# Patient Record
Sex: Male | Born: 1990 | Race: White | Hispanic: No | Marital: Single | Smoking: Never smoker
Health system: Southern US, Community
[De-identification: ages and names within clinical notes are randomized; demographics above are authoritative.]

## PROBLEM LIST (undated history)

## (undated) DIAGNOSIS — E0590 Thyrotoxicosis, unspecified without thyrotoxic crisis or storm: Secondary | ICD-10-CM

## (undated) DIAGNOSIS — Z789 Other specified health status: Secondary | ICD-10-CM

## (undated) HISTORY — PX: TYMPANOSTOMY TUBE PLACEMENT: SHX32

## (undated) NOTE — ED Provider Notes (Signed)
 Formatting of this note is different from the original. EMERGENCY DEPARTMENT ENCOUNTER NOTE  Chief complaint:  Chief Complaint  Patient presents with  ? Psychiatric Evaluation    SI, NOT TAKING PRESCRIBED MEDS IN WEEKS, PT REFUSING TO ANSWER ANY QUESTIONS   History of Present Illness:  Roberto Watts presents to the Emergency Department brought by his parents concerned for his mental health. He has a history of depression and stopped taking his medications a few weeks ago while attending college here at [employer]. He stopped attending class and was drinking and smoking marijuana and his parents picked him up to take him out of that environment and back home.  He agrees to see his doctor and start back on his medications at home. His father has made an appointment for him to see his doctor Tuesday. His father feels she is safe to leave her as long as he is accompanied by his family which he will be. He has recently voiced some vague suicidal ideations but has no plan and is not actively suicidal.  Past Medical History  Diagnosis Date  ? Asthma   ? ADHD (attention deficit hyperactivity disorder)   ? Depression   ? Anxiety    Allergies  Allergen Reactions  ? Penicillins    No current facility-administered medications for this encounter.   Current Outpatient Prescriptions  Medication Sig Dispense Refill  ? albuterol  (VENTOLIN  HFA;PROAIR ) 90 mcg/actuation inhaler Inhale 2 puffs into the lungs every 4 (four) hours.      ? amphetamine-dextroamphetamine (ADDERALL) 10 mg per tablet Take 10 mg by mouth daily.      ? naproxen (NAPROSYN) 500 MG tablet Take 500 mg by mouth 2 (two) times daily with meals.      ? sertraline (ZOLOFT) 50 MG tablet Take 50 mg by mouth daily. MAY TAKE 1 TO 2 TABLETS DAILY      ? cyclobenzaprine (FLEXERIL) 10 MG tablet Take 10 mg by mouth 2 (two) times daily.      ? fluticasone -salmeterol (ADVAIR) 100-50 mcg/dose diskus inhaler Inhale 1 puff into the lungs 2 (two) times daily.       ? fluticasone -salmeterol (ADVAIR) 250-50 mcg/dose diskus inhaler Inhale 1 puff into the lungs daily.        No past surgical history on file.  History   Social History  ? Marital Status: N/A    Spouse Name: N/A    Number of Children: N/A  ? Years of Education: N/A   Occupational History  ? Not on file.   Social History Main Topics  ? Smoking status: Not on file  ? Smokeless tobacco: Not on file  ? Alcohol Use: Not on file  ? Drug Use: Not on file  ? Sexually Active: Not on file   Other Topics Concern  ? Not on file   Social History Narrative  ? No narrative on file   No family history on file.  Review Of Systems:  A complete 10 point review of systems is negative except as stated in HPI  Physical Examination: BP 138/78  Pulse 65  Temp(Src) 98 F (36.7 C) (Oral)  Resp 16  SpO2 100%  General:  Nontoxic appearing, depressed-appearing 65 year old otherwise in no acute distress  Head and Neck:  Atraumatic. Neck with full range of motion no JVD or meningismus.  Eyes: EOMI, sclera white conjunctiva clear  Ears, Nose and oropharynx: External ears WNL, nose WNL, mucous membranes moist Chest:  Regular rate and rhythm and no extrasystoles were  noted.  No obvious murmurs, rubs, clicks, or gallops. Respiratory:  No respiratory distress. Clear to auscultation bilaterally Back: Normal range of motion.  GI:  Non-distended. Soft nontender Musculoskeletal:  No clubbing or cyanosis, no edema. No deformities. No signs of acute trauma. Pulses equal bilaterally Integument: Intact, normal color, no rash. Lymphatic:  No lymphadenopathy noted  Neurologic:  Alert & oriented x 3, CN 2-12 normal as tested, nonfocal Psychiatric:  Depressed affect  ED Course: Considering good followup and patient who is cooperative with future treatment plan he appears stable to be discharged. Father also feels this is safe.  Clinical Impression/Assessment and Plan: Depression, suicidal  ideation  Prentice All, MD 11/30/11 1437 Electronically signed by Prentice All, MD at 11/30/2011  2:37 PM EDT

## (undated) NOTE — ED Notes (Signed)
 Formatting of this note might be different from the original. PT DISCHARGED WITH PARENTS TO HOME. NO COMPLAINTS OR COMPLICATIONS NOTED.   Curtistine JENEANE Fellers, RN 11/30/11 [phone] Electronically signed by Curtistine JENEANE Fellers, RN at 11/30/2011  3:16 PM EDT

## (undated) NOTE — ED Notes (Signed)
 Formatting of this note might be different from the original. 20y/o male seen in ED after being brought in by EMS. Per EMS they were called by patient's father who reported the patient was suicidal. On arrival patient refused to answer any questions, he was resistant at first to changing clothing but did comply. Medications with him were prescribed by a Dr. Glover. Meds included Zofoft, Adderal and asthma meds. He states he is not taking meds as prescribed. Father and mother presented and informed Dr.Terzian that the want to take patient home with them to see his doctor. Patient finally did cooperate with assessment. He states he is depressed and has been for years. He states he will not harm self and has no plan to do so...they put words in my mouth. Mother does have a note scribbled on a receipt that was rambling and said he could not find himself, was apologetic. 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 Parents state they have an appt with Dr. Glover on Monday and will get patient admitted to a hospital nearer home. They plan to remain with patient all weekend until he is seen by a physician. Patient has been seen by a psychologist in past and states he did not want to keep talking about my feelings.   Nancy Diane Daneau, RN 11/30/11 [phone] Electronically signed by Inocente Loa Catholic, RN at 11/30/2011  2:48 PM EDT

---

## 2011-11-16 ENCOUNTER — Ambulatory Visit: Payer: Self-pay | Admitting: Internal Medicine

## 2011-11-18 LAB — BETA STREP CULTURE(ARMC)

## 2012-01-19 ENCOUNTER — Inpatient Hospital Stay: Payer: Self-pay | Admitting: Psychiatry

## 2012-01-19 LAB — SALICYLATE LEVEL: Salicylates, Serum: <1.7 mg/dL

## 2012-01-19 LAB — ETHANOL
Ethanol %: <0.003 % (ref 0.000–0.080)
Ethanol: <3 mg/dL

## 2012-01-19 LAB — COMPREHENSIVE METABOLIC PANEL
Albumin: 4.4 g/dL (ref 3.4–5.0)
Anion Gap: 5 — ABNORMAL LOW (ref 7–16)
Bilirubin,Total: 1.9 mg/dL — ABNORMAL HIGH (ref 0.2–1.0)
Calcium, Total: 9.1 mg/dL (ref 8.5–10.1)
Co2: 32 mmol/L (ref 21–32)
Osmolality: 281 (ref 275–301)
Potassium: 4.5 mmol/L (ref 3.5–5.1)
SGOT(AST): 23 U/L (ref 15–37)

## 2012-01-19 LAB — CBC
HCT: 44.3 % (ref 40.0–52.0)
MCH: 34.1 pg — ABNORMAL HIGH (ref 26.0–34.0)
Platelet: 171 10*3/uL (ref 150–440)
RBC: 4.46 10*6/uL (ref 4.40–5.90)
WBC: 5.7 10*3/uL (ref 3.8–10.6)

## 2012-01-19 LAB — DRUG SCREEN, URINE
Barbiturates, Ur Screen: NEGATIVE (ref ?–200)
Cannabinoid 50 Ng, Ur ~~LOC~~: POSITIVE (ref ?–50)
Cocaine Metabolite,Ur ~~LOC~~: NEGATIVE (ref ?–300)
Opiate, Ur Screen: NEGATIVE (ref ?–300)
Tricyclic, Ur Screen: POSITIVE (ref ?–1000)

## 2012-01-19 LAB — ACETAMINOPHEN LEVEL: Acetaminophen: <2 ug/mL

## 2012-01-19 LAB — TSH: Thyroid Stimulating Horm: 1.2 u[IU]/mL

## 2012-04-23 ENCOUNTER — Emergency Department: Payer: Self-pay | Admitting: Emergency Medicine

## 2012-04-23 LAB — COMPREHENSIVE METABOLIC PANEL
Albumin: 5.1 g/dL — ABNORMAL HIGH (ref 3.4–5.0)
Anion Gap: 10 (ref 7–16)
BUN: 20 mg/dL — ABNORMAL HIGH (ref 7–18)
Bilirubin,Total: 2.3 mg/dL — ABNORMAL HIGH (ref 0.2–1.0)
Chloride: 102 mmol/L (ref 98–107)
Creatinine: 1.13 mg/dL (ref 0.60–1.30)
EGFR (African American): >60
Osmolality: 281 (ref 275–301)
Potassium: 3.8 mmol/L (ref 3.5–5.1)
SGPT (ALT): 29 U/L (ref 12–78)
Total Protein: 8.3 g/dL — ABNORMAL HIGH (ref 6.4–8.2)

## 2012-04-23 LAB — URINALYSIS, COMPLETE
Bacteria: NONE SEEN
Bilirubin,UR: NEGATIVE
Blood: NEGATIVE
Glucose,UR: NEGATIVE mg/dL (ref 0–75)
Ketone: NEGATIVE
Leukocyte Esterase: NEGATIVE
Ph: 5 (ref 4.5–8.0)
Protein: 30
Specific Gravity: 1.025 (ref 1.003–1.030)
Squamous Epithelial: NONE SEEN
WBC UR: <1 /HPF (ref 0–5)

## 2012-04-23 LAB — CBC
HCT: 46.4 % (ref 40.0–52.0)
Platelet: 270 10*3/uL (ref 150–440)
RDW: 12.1 % (ref 11.5–14.5)
WBC: 10.7 10*3/uL — ABNORMAL HIGH (ref 3.8–10.6)

## 2012-04-23 LAB — TSH: Thyroid Stimulating Horm: 2.04 u[IU]/mL

## 2012-04-23 LAB — ETHANOL
Ethanol %: <0.003 % (ref 0.000–0.080)
Ethanol: <3 mg/dL

## 2012-04-24 LAB — DRUG SCREEN, URINE
Amphetamines, Ur Screen: NEGATIVE (ref ?–1000)
Benzodiazepine, Ur Scrn: NEGATIVE (ref ?–200)
Methadone, Ur Screen: NEGATIVE (ref ?–300)
Phencyclidine (PCP) Ur S: NEGATIVE (ref ?–25)
Tricyclic, Ur Screen: NEGATIVE (ref ?–1000)

## 2015-01-03 NOTE — Consult Note (Signed)
Brief Consult Note: Diagnosis: major depression severe.   Patient was seen by consultant.   Consult note dictated.   Recommend further assessment or treatment.   Orders entered.   Comments: Psychiatry: Patient seen. Also known to me from prior treatment. Patient meets commitment criteria and needs hospitalization. Recent behavior makes him inappropriate at our facility. Has been referred to [facility] for transfer soon.  Electronic Signatures: Audery Amellapacs, John T (MD)  (Signed 09-Aug-13 11:13)  Authored: Brief Consult Note   Last Updated: 09-Aug-13 11:13 by Audery Amellapacs, John T (MD)
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
## 2015-01-08 NOTE — H&P (Signed)
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 The patient is single and never married and lives with his parents who are 5645 and 23 years old. The patient comes for his first inpatient hospitalization to psychiatry at [facility] with the chief complaint "my parents tricked me. They called the cops and they brought me here.  I was feeling depressed and quit going to my classes and they got worried and they brought me home from my college, in [place]."   HISTORY OF PRESENT ILLNESS: When the patient was asked when he last felt well he reported that he is in college in [place] and he snapped at his girlfriend of 9 months and did not mean it and he let her go and then he got more depressed about the same.  He quit going to classes. His parents got worried and brought him back home. He was seen by Dr. Mordecai RasmussenJohn Clapacs on 01/17/2012 and he has an appointment coming up on 01/22/2012. He is being followed for depression. His parents got worried and they brought him for admission here. According to information obtained from the chart and IVC taken out, the patient has been depressed and angry, and getting into arguments, and getting into fights and talked about it to the psychiatrist.  PAST PSYCHIATRIC HISTORY: No history of inpatient hospitalization to psychiatry. No history of suicide attempt. Being followed by Dr. Mordecai RasmussenJohn Clapacs as stated above. No history of suicide attempts.  FAMILY HISTORY OF MENTAL ILLNESS:  No history of suicide in the family.  FAMILY HISTORY: Raised by parents.  Father works as a Research scientist (medical)lab technician at [employer]. Mother works for  [employer]. He has one younger brother, close to family.  PERSONAL HISTORY: He was born  [place]. He graduated from high school. Currently he in at [place], second year of college.  WORK HISTORY: First job lasted three years.  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    MILITARY HISTORY: None.   MARRIAGES: Never married. Has had only one relationship so far and this lasted 9 months. He let her go because he was feeling depressed himself and after he let her go he got more depressed. No children.  He went to high school prom.    ALCOHOL AND DRUGS: First drink of alcohol was in eighth grade. No problems with alcohol drinking. He gets a drink occasionally with friends. He denies street or prescription drug abuse. He does admit smoking THC every other day for two years. He last smoked a few days ago. Denies smoking nicotine cigarettes.   MEDICAL HISTORY: No history of known high blood pressure, no known history of diabetes mellitus. No history of motor vehicle accident. Never been unconscious. Allergic to penicillin. He is being followed at [facility], by Dr. Terance HartBronstein. His last appointment was six weeks ago. His next appointment is to be made as needed.  PHYSICAL EXAMINATION:  VITALS: Temperature 97.7, pulse 83 per minute and regular.  HEENT: Head is normocephalic, atraumatic. Pupils are equally round and reactive to light and accommodation. Fundi bilaterally benign. Extraocular movements visualized. Tympanic membranes visualized.   NECK: Soft and supple without any organomegaly, lymphadenopathy or thyromegaly.   CHECK:  Normal expansion. Normal breath sounds heard.  HEART: Normal S1 and S2 without any murmurs or gallops.  ABDOMEN: Soft, no organomegaly. Bowel sounds heard.   NEURO: Gait is normal. Romberg is negative. Cranial nerves II through XII grossly intact. DTRs 2+ and normal. Plantars have normal response.   MENTAL STATUS EXAMINATION: The patient is dressed in hospital pajamas, alert and oriented to place, person, and time and is fully  aware of the situation that brought him for admission to [facility].  He is upset and irritable about his admission here because he feels that his parents tricked him to bring him here.  Affect is appropriate with his mood which is low, down, and depressed. He stated who does not feel depressed. He does admit feeling depressed. He admits to feeling hopeless and helpless about himself at this time. He does admit to feeling worthless and useless at times but not at this time.  Admits that he sleeps too much. Regarding appetite, he stated that he eats whenever he can. No evidence of psychosis. Denies auditory or visual hallucinations. Denies hearing voices or seeing things. Denies paranoid or suspicious ideas. General knowledge information is fair. He could spell the word world forward and backward without any problem. He could count money. Recall and memory are good. Insight and judgment are guarded.   IMPRESSION:  AXIS I:  1. Major depressive disorder, single episode.  2. THC abuse. 3. Adjustment disorder with depressed mood.  AXIS II: Deferred.  AXIS III: None major.  AXIS IV: Severe - the patient broke up with his girlfriend of 9 months and since the breakup his depression got worse and he quit going to classes which made it worse and had to get help for the same.   AXIS V: GAF 25.  PLAN: The patient is admitted to [facility] for closer observation, evaluation and help. He will be started back on all the medication that he has been getting from Dr. Mordecai Rasmussen. During his stay in the hospital, he will be given milieu therapy and supportive counseling. He will take part in individual and group therapy where relationship issues will be addressed. At the time of discharge, his depression will get better and he will have better insight into his problems so that he will be functional and appropriate followup appointments will be made.  ____________________________ Jannet Mantis. Guss Bunde,  MD skc:slb D: 01/19/2012 16:59:52 ET     T: 01/20/2012 07:52:38 ET        JOB#: [id number] cc: Monika Salk K. Guss Bunde, MD, <Dictator> Beau Fanny MD ELECTRONICALLY SIGNED 01/25/2012 17:31

## 2015-01-08 NOTE — Consult Note (Signed)
Psychological Assessment  Lucrezia StarchBrandyn Young20of Evaluation: 5-6-13Administered: [facility] Multiphasic Personality Inventory-2 (MMPI-2) for Referral: [name] was referred for a psychological assessment by his physician, Sherrian DiversJ. Terry Clapacs.  He was admitted to Behavioral Medicine for treatment of increasing depression. Please see the history and physical and psychosocial history for further background information. An assessment of personality structure was requested. Mr. Roxy CedarYoung?s MMPI-2 protocol is compared to that of other adult males he obtained the following profile: [phone]*78"02?4+-195/3:#. The MMPI-2 validity scales indicate that the clinical profile is probably valid. Validity scale elevations suggest he is a seriously disturbed individual in the context of no known reason for secondary gain. PresentationHe reports that he is experiencing severe emotional distress characterized by dysphoric mood, agitation, worrying, and anhedonia. His affect is likely to be blunted or inappropriate. He sees little opportunity of improving his circumstances, further dampening his mood, yet his daily life is full of things that keep him interested. He is very fearful, easily frightened, and generally apprehensive. He frequently worries about something or someone. He is more sensitive and feels more intensely than most people. His feelings are easily hurt and he is inclined to take things hard. He easily becomes impatient with people and often has serious disagreements with people who are close to him. He is often irritable and grouchy. It makes him angry when people give him advice or hurry him. He also gets angry with himself for giving in to others so much. He has become so angry that he does not know what comes over him and he feels as though he will explode. At times he feels like smashing things. He reports that he has problems with attention and concentration and memory. He lacks self-confidence, believes that he is not as  good as other people and feels inferior and insecure. He has a hard time making decisions and he feels helpless when he has to make some important decisions. has had very strange and peculiar thoughts. He thinks and dreams of things that are too bad to talk about. He thinks that there is something wrong with his mind. Everything is going on too fast around him. He has often thought that strangers were looking at him critically and he is bothered by people on the street or in stores watching him. He is sure that he is being talked about, he is being plotted against, people say vulgar and insulting things about him and someone has it in for him. He has enemies who wish to harm him. If people had not had it in for him, he would have been much more successful. He is sure he is often being punished without cause. In everything he does lately, he thinks that he is being tested. thinks that most people will use unfair means and stretch the truth to get ahead. He often wonders what hidden reason another person may have for doing something nice for him. He believes that it is safer to trust nobody. He has often been misunderstood when he was trying to be helpful. He knows he is a burden to others. Relations: He reports that he is extremely introverted. At parties he is more likely to sit by himself or with one other person than to join in with the crowd. His behavior is likely to be unpredictable and inappropriate, also making others uneasy around him. He is suspicious and distrustful of others, and he avoids serious emotional relationships. He generally feels apathetic, socially isolated, and withdrawn, and he believes that no one understands him.  He feels lonely most of the time even when he is with people. He is likely not to speak to people until they speak to him. He wishes he was not so shy. When in a group of people he has trouble thinking of the right things to talk about. He has poor relations with his family and  is alienated from himself and others. Once in a while he feels hate toward members of his family whom he usually loves. Problem Areas: He reports few symptoms. During the last few years he has been well most of the time and he is in just as good physical health as most of his friends. He seldom worries about his health. Most nights he does not go to sleep without thoughts or ideas bothering him and he does not wake up fresh and rested most mornings. He usually has enough energy to do his work and he is about as able to work as he ever was. He is very likely to abuse substances. He is worried about sex. He denies suicidal ideation but he feels hopeless which increases the risk of suicide.  His prognosis is generally very guarded because his problems are chronic and severe. His ability to work may not be severely impaired as long as the job does not involve any appreciable amount of contact with people. Some type of psychopharmacologic intervention may be necessary to stabilize his thought processes and mood and to help him sleep. Short-term, behavioral interventions are warranted rather than any form of insight-oriented psychotherapy.are a number of specific issues that must be kept in mind when establishing and maintaining the therapeutic alliance: no one seems to understand him, he has difficulty starting to do things, he believes it is safer to trust nobody, he is so touchy on some subjects that he cannot talk about them, he gives up quickly when things go wrong or get difficult or because he thinks too little of his ability, he shrinks from facing a crisis or difficulty, he has done some bad things in the past that he will never tell anyone about, it makes him nervous when people ask him personal questions, he feels unable to tell anyone all about himself, he is hard to get to know, he is very stubborn, he is bothered greatly by the thought of making changes in his life, he hates going to doctors even when he  is sick, he has had a tragic loss in his life that he will never get over and it is hard for him to accept compliments. 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

## 2015-05-06 ENCOUNTER — Encounter: Payer: Self-pay | Admitting: Gynecology

## 2015-05-06 ENCOUNTER — Ambulatory Visit
Admission: EM | Admit: 2015-05-06 | Discharge: 2015-05-06 | Disposition: A | Discharge status: Home / Self Care | Payer: Worker's Compensation | Attending: Family Medicine | Admitting: Family Medicine

## 2015-05-06 DIAGNOSIS — X58XXXS Exposure to other specified factors, sequela: Secondary | ICD-10-CM | POA: Insufficient documentation

## 2015-05-06 DIAGNOSIS — T675XXS Heat exhaustion, unspecified, sequela: Secondary | ICD-10-CM | POA: Diagnosis not present

## 2015-05-06 DIAGNOSIS — N179 Acute kidney failure, unspecified: Secondary | ICD-10-CM | POA: Diagnosis present

## 2015-05-06 LAB — BASIC METABOLIC PANEL
Anion gap: 13 (ref 5–15)
BUN: 18 mg/dL (ref 6–20)
CHLORIDE: 101 mmol/L (ref 101–111)
CO2: 24 mmol/L (ref 22–32)
CREATININE: 1.26 mg/dL — AB (ref 0.61–1.24)
Calcium: 10.2 mg/dL (ref 8.9–10.3)
GFR calc non Af Amer: >60 mL/min (ref >60)
Glucose, Bld: 90 mg/dL (ref 65–99)
Potassium: 3.8 mmol/L (ref 3.5–5.1)
Sodium: 138 mmol/L (ref 135–145)

## 2015-05-06 NOTE — ED Provider Notes (Signed)
CSN: [id number]     Arrival date & time 05/06/15  1328 History   None    Chief Complaint  Patient presents with  . Follow-up    Seen at [facility] on 05/03/2015 for acute kidney injury and dehydration   (Consider location/radiation/quality/duration/timing/severity/associated sxs/prior Treatment) HPI   24 year old male coming by his father is here for follow-up. He had heat exhaustion on 05/03/2015. 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 He is discharged all living day but his father brings him in today for a follow-up and a recheck of his kidney function. The patient is alert and oriented although he seems tired and weak. He is scheduled to return to work on Monday.  Past Medical History  Diagnosis Date  . Thyroid disease    Past Surgical History  Procedure Laterality Date  . Tympanostomy tube placement     No family history on file. Social History  Substance Use Topics  . Smoking status: Never Smoker   . Smokeless tobacco: None  . Alcohol Use: No    Review of Systems  Constitutional: Positive for fatigue.  All other systems reviewed and are negative.   Allergies  Advil and Penicillins  Home Medications   Prior to Admission medications   Medication Sig Start Date End Date Taking? Authorizing Provider  levothyroxine (SYNTHROID, LEVOTHROID) 25 MCG tablet Take 25 mcg by mouth daily before breakfast.   Yes Historical Provider, MD   BP 138/87 mmHg  Pulse 86  Temp(Src) 97.6 F (36.4 C) (Tympanic)  Ht 6' (1.829 m)  Wt 145 lb (65.772 kg)  BMI 19.66 kg/m2  SpO2 100% Physical Exam  Constitutional: He is oriented to person, place, and time. He appears well-developed and well-nourished.  HENT:  Head: Normocephalic and atraumatic.  Right Ear: External ear normal.  Left Ear: External ear normal.  Eyes: Pupils are equal, round, and  reactive to light.  Cardiovascular: Normal rate, regular rhythm and normal heart sounds.  Exam reveals no gallop and no friction rub.   No murmur heard. Pulmonary/Chest: Effort normal and breath sounds normal. No respiratory distress. He has no wheezes. He has no rales.  Abdominal: Soft. Bowel sounds are normal.  Musculoskeletal: Normal range of motion.  Neurological: He is alert and oriented to person, place, and time.  Skin: Skin is warm and dry.  Psychiatric: He has a normal mood and affect. His behavior is normal. Judgment normal.  Nursing note and vitals reviewed.   ED Course  Procedures (including critical care time) Labs Review Labs Reviewed  BASIC METABOLIC PANEL - Abnormal; Notable for the following:    Creatinine, Ser 1.26 (*)    All other components within normal limits    Imaging Review No results found.   MDM   1. Heat exhaustion, sequela    Plan: 1. Test/x-ray results and diagnosis reviewed with patient 2. rx as per orders; risks, benefits, potential side effects reviewed with patient 3. Recommend supportive treatment with fluids,rest protein diet  4. F/u prn if symptoms worsen or don't improve I've advised the patient to increase his fluid intake to keep his urine color the same as the water. He should rest a good quality food and should be return to work by Wednesday with no strenuous activity for 1 week. He should follow-up with his primary care physician as soon as possible. No  medication is necessary at this time. He may return here for follow-up as necessary    Lutricia Feil, PA-C 05/06/15 1632

## 2015-05-06 NOTE — ED Notes (Signed)
Patient c/o follow up from hospital visit. Per patient dad would like repeat blood work to check kidney function.

## 2016-05-15 ENCOUNTER — Emergency Department
Admission: EM | Admit: 2016-05-15 | Discharge: 2016-05-16 | Disposition: A | Discharge status: Other Acute Inpatient Hospital | Payer: BLUE CROSS/BLUE SHIELD | Attending: Emergency Medicine | Admitting: Emergency Medicine

## 2016-05-15 DIAGNOSIS — R45851 Suicidal ideations: Secondary | ICD-10-CM | POA: Diagnosis present

## 2016-05-15 DIAGNOSIS — F1911 Other psychoactive substance abuse, in remission: Secondary | ICD-10-CM | POA: Insufficient documentation

## 2016-05-15 DIAGNOSIS — F329 Major depressive disorder, single episode, unspecified: Secondary | ICD-10-CM | POA: Insufficient documentation

## 2016-05-15 DIAGNOSIS — F1490 Cocaine use, unspecified, uncomplicated: Secondary | ICD-10-CM | POA: Diagnosis not present

## 2016-05-15 DIAGNOSIS — F1290 Cannabis use, unspecified, uncomplicated: Secondary | ICD-10-CM | POA: Diagnosis not present

## 2016-05-15 DIAGNOSIS — Z79899 Other long term (current) drug therapy: Secondary | ICD-10-CM | POA: Diagnosis not present

## 2016-05-15 DIAGNOSIS — F333 Major depressive disorder, recurrent, severe with psychotic symptoms: Secondary | ICD-10-CM | POA: Diagnosis not present

## 2016-05-15 DIAGNOSIS — F32A Depression, unspecified: Secondary | ICD-10-CM

## 2016-05-15 LAB — COMPREHENSIVE METABOLIC PANEL
ALT: 40 U/L (ref 17–63)
AST: 32 U/L (ref 15–41)
Albumin: 4.8 g/dL (ref 3.5–5.0)
Alkaline Phosphatase: 93 U/L (ref 38–126)
Anion gap: 10 (ref 5–15)
BILIRUBIN TOTAL: 2.2 mg/dL — AB (ref 0.3–1.2)
BUN: 15 mg/dL (ref 6–20)
CO2: 26 mmol/L (ref 22–32)
CREATININE: 0.71 mg/dL (ref 0.61–1.24)
Calcium: 9.8 mg/dL (ref 8.9–10.3)
Chloride: 102 mmol/L (ref 101–111)
GFR calc Af Amer: >60 mL/min (ref >60)
Glucose, Bld: 99 mg/dL (ref 65–99)
POTASSIUM: 4.2 mmol/L (ref 3.5–5.1)
Sodium: 138 mmol/L (ref 135–145)
TOTAL PROTEIN: 7.6 g/dL (ref 6.5–8.1)

## 2016-05-15 LAB — CBC
HCT: 45.4 % (ref 40.0–52.0)
Hemoglobin: 16.1 g/dL (ref 13.0–18.0)
MCH: 33.5 pg (ref 26.0–34.0)
MCHC: 35.6 g/dL (ref 32.0–36.0)
MCV: 94 fL (ref 80.0–100.0)
PLATELETS: 163 10*3/uL (ref 150–440)
RBC: 4.82 MIL/uL (ref 4.40–5.90)
RDW: 11.9 % (ref 11.5–14.5)
WBC: 5 10*3/uL (ref 3.8–10.6)

## 2016-05-15 LAB — URINE DRUG SCREEN, QUALITATIVE (ARMC ONLY)
Amphetamines, Ur Screen: NOT DETECTED
BENZODIAZEPINE, UR SCRN: NOT DETECTED
Barbiturates, Ur Screen: NOT DETECTED
CANNABINOID 50 NG, UR ~~LOC~~: NOT DETECTED
Cocaine Metabolite,Ur ~~LOC~~: NOT DETECTED
MDMA (Ecstasy)Ur Screen: NOT DETECTED
Methadone Scn, Ur: NOT DETECTED
Opiate, Ur Screen: NOT DETECTED
PHENCYCLIDINE (PCP) UR S: NOT DETECTED
Tricyclic, Ur Screen: NOT DETECTED

## 2016-05-15 LAB — SALICYLATE LEVEL: Salicylate Lvl: <4 mg/dL (ref 2.8–30.0)

## 2016-05-15 LAB — ACETAMINOPHEN LEVEL: Acetaminophen (Tylenol), Serum: <10 ug/mL — ABNORMAL LOW (ref 10–30)

## 2016-05-15 LAB — ETHANOL

## 2016-05-15 NOTE — ED Notes (Signed)
Pts mother Ike BeneKelly Couts left phone number [phone]

## 2016-05-15 NOTE — ED Provider Notes (Signed)
[facility] Emergency Department Provider Note    ____________________________________________   I have reviewed the triage vital signs and the nursing notes.   HISTORY  Chief Complaint Suicidal   History limited by: Poor historian    HPI Roberto Watts is a 25 y.o. male  who presents to the emergency department today under IVC paperwork because of concerns for depression and suicidal ideation. The patient states that he has had depression however will not tell me how long it is been going on however when asked if it is been going on years he says perhaps. He denies ever being put on medication for his depression. He denies ever seeing anyone in the past for his depression however he is coming from away since. He was IVC there. He does state that he has been having thoughts of wanting to hurt himself. He denies any medical complaints. Denies any fevers, nausea vomiting or diarrhea. No chest pain or shortness breath.   Past Medical History:  Diagnosis Date  . Thyroid disease     There are no active problems to display for this patient.   Past Surgical History:  Procedure Laterality Date  . TYMPANOSTOMY TUBE PLACEMENT      Prior to Admission medications   Medication Sig Start Date End Date Taking? Authorizing Provider  levothyroxine (SYNTHROID, LEVOTHROID) 25 MCG tablet Take 25 mcg by mouth daily before breakfast.    Historical Provider, MD    Allergies Advil [ibuprofen] and Penicillins  No family history on file.  Social History Social History  Substance Use Topics  . Smoking status: Never Smoker  . Smokeless tobacco: Never Used  . Alcohol use No    Review of Systems  Constitutional: Negative for fever. Cardiovascular: Negative for chest pain. Respiratory: Negative for shortness of breath. Gastrointestinal: Negative for abdominal pain, vomiting and diarrhea. Neurological: Negative for headaches, focal weakness or numbness.  10-point  ROS otherwise negative.  ____________________________________________   PHYSICAL EXAM:  VITAL SIGNS: ED Triage Vitals [05/15/16 1520]  Enc Vitals Group     BP 129/90     Pulse Rate (!) 101     Resp 16     Temp 98.2 F (36.8 C)     Temp Source Oral     SpO2 98 %     Weight 160 lb (72.6 kg)     Height 5\' 11"  (1.803 m)   Constitutional: Alert and oriented. Slightly depressed. Eyes: Conjunctivae are normal. Normal extraocular movements. ENT   Head: Normocephalic and atraumatic.   Nose: No congestion/rhinnorhea.   Mouth/Throat: Mucous membranes are moist.   Neck: No stridor. Hematological/Lymphatic/Immunilogical: No cervical lymphadenopathy. Cardiovascular: Normal rate, regular rhythm.  No murmurs, rubs, or gallops. Respiratory: Normal respiratory effort without tachypnea nor retractions. Breath sounds are clear and equal bilaterally. No wheezes/rales/rhonchi. Gastrointestinal: Soft and nontender. No distention.  Genitourinary: Deferred Musculoskeletal: Normal range of motion in all extremities. No lower extremity edema. Neurologic:  Normal speech and language. No gross focal neurologic deficits are appreciated.  Skin:  Skin is warm, dry and intact. No rash noted. Psychiatric: Slightly depressed. Endorses suicidal ideation.  ____________________________________________    LABS (pertinent positives/negatives)  Labs Reviewed  COMPREHENSIVE METABOLIC PANEL - Abnormal; Notable for the following:       Result Value   Total Bilirubin 2.2 (*)    All other components within normal limits  ACETAMINOPHEN LEVEL - Abnormal; Notable for the following:    Acetaminophen (Tylenol), Serum <10 (*)    All other  components within normal limits  ETHANOL  SALICYLATE LEVEL  CBC  URINE DRUG SCREEN, QUALITATIVE ([facility] ONLY)    ____________________________________________   EKG  None  ____________________________________________     RADIOLOGY  None  ____________________________________________   PROCEDURES  Procedures  ____________________________________________   INITIAL IMPRESSION / ASSESSMENT AND PLAN / ED COURSE  Pertinent labs & imaging results that were available during my care of the patient were reviewed by me and considered in my medical decision making (see chart for details).  Patient presents to the emergency department today under IVC because of concerns for depression suicidal ideation. On exam patient does appear slightly depressed. He is a somewhat poor historian. He does however endorse depression, thoughts of hopelessness as well as suicidal ideation. Will have patient be seen by psychiatry. Will continue IVC. ____________________________________________   FINAL CLINICAL IMPRESSION(S) / ED DIAGNOSES  Final diagnoses:  Depression  Suicidal ideation     Note: This dictation was prepared with Dragon dictation. Any transcriptional errors that result from this process are unintentional    Phineas Semen, MD 05/15/16 [phone]

## 2016-05-15 NOTE — ED Notes (Signed)
IVC/ Consult pending 

## 2016-05-15 NOTE — ED Notes (Signed)
Pt given crackers and a drink 

## 2016-05-15 NOTE — ED Triage Notes (Signed)
Pt ivc sent from [facility] for suicidal thoughts and plan to shoot self

## 2016-05-16 ENCOUNTER — Inpatient Hospital Stay
Admit: 2016-05-16 | Discharge: 2016-05-19 | DRG: 885 | Disposition: A | Discharge status: Other Acute Inpatient Hospital | Payer: BLUE CROSS/BLUE SHIELD | Attending: Psychiatry | Admitting: Psychiatry

## 2016-05-16 DIAGNOSIS — F333 Major depressive disorder, recurrent, severe with psychotic symptoms: Principal | ICD-10-CM | POA: Diagnosis present

## 2016-05-16 DIAGNOSIS — Z880 Allergy status to penicillin: Secondary | ICD-10-CM | POA: Diagnosis not present

## 2016-05-16 DIAGNOSIS — S0990XA Unspecified injury of head, initial encounter: Secondary | ICD-10-CM | POA: Diagnosis present

## 2016-05-16 DIAGNOSIS — I959 Hypotension, unspecified: Secondary | ICD-10-CM | POA: Diagnosis present

## 2016-05-16 DIAGNOSIS — Z9181 History of falling: Secondary | ICD-10-CM

## 2016-05-16 DIAGNOSIS — Z886 Allergy status to analgesic agent status: Secondary | ICD-10-CM | POA: Diagnosis not present

## 2016-05-16 DIAGNOSIS — R000 Tachycardia, unspecified: Secondary | ICD-10-CM | POA: Diagnosis present

## 2016-05-16 DIAGNOSIS — F250 Schizoaffective disorder, bipolar type: Secondary | ICD-10-CM | POA: Diagnosis present

## 2016-05-16 DIAGNOSIS — W19XXXA Unspecified fall, initial encounter: Secondary | ICD-10-CM | POA: Diagnosis present

## 2016-05-16 DIAGNOSIS — Z818 Family history of other mental and behavioral disorders: Secondary | ICD-10-CM | POA: Diagnosis not present

## 2016-05-16 DIAGNOSIS — F29 Unspecified psychosis not due to a substance or known physiological condition: Secondary | ICD-10-CM

## 2016-05-16 DIAGNOSIS — Y92239 Unspecified place in hospital as the place of occurrence of the external cause: Secondary | ICD-10-CM | POA: Diagnosis present

## 2016-05-16 DIAGNOSIS — E860 Dehydration: Secondary | ICD-10-CM | POA: Diagnosis present

## 2016-05-16 DIAGNOSIS — R45851 Suicidal ideations: Secondary | ICD-10-CM | POA: Diagnosis present

## 2016-05-16 DIAGNOSIS — Z9119 Patient's noncompliance with other medical treatment and regimen: Secondary | ICD-10-CM | POA: Diagnosis not present

## 2016-05-16 DIAGNOSIS — F3160 Bipolar disorder, current episode mixed, unspecified: Secondary | ICD-10-CM

## 2016-05-16 DIAGNOSIS — Z9889 Other specified postprocedural states: Secondary | ICD-10-CM | POA: Diagnosis not present

## 2016-05-16 HISTORY — DX: Other specified health status: Z78.9

## 2016-05-16 MED ORDER — QUETIAPINE FUMARATE 25 MG PO TABS: 100.0000 mg | ORAL_TABLET | Freq: Every day | ORAL | Status: DC

## 2016-05-16 MED ORDER — ACETAMINOPHEN 325 MG PO TABS: 650.0000 mg | ORAL_TABLET | Freq: Four times a day (QID) | ORAL | Status: DC | PRN

## 2016-05-16 MED ORDER — MAGNESIUM HYDROXIDE 400 MG/5ML PO SUSP: 30.0000 mL | Freq: Every day | ORAL | Status: DC | PRN

## 2016-05-16 MED ORDER — ESCITALOPRAM OXALATE 10 MG PO TABS
10.0000 mg | ORAL_TABLET | Freq: Every day | ORAL | Status: DC
Start: 2016-05-16 — End: 2016-05-16
  Filled 2016-05-16: qty 1

## 2016-05-16 MED ORDER — QUETIAPINE FUMARATE 100 MG PO TABS
100.0000 mg | ORAL_TABLET | Freq: Every day | ORAL | Status: DC
  Administered 2016-05-17: 100 mg via ORAL
  Filled 2016-05-16: qty 1

## 2016-05-16 MED ORDER — ALUM & MAG HYDROXIDE-SIMETH 200-200-20 MG/5ML PO SUSP: 30.0000 mL | ORAL | Status: DC | PRN

## 2016-05-16 MED ORDER — ESCITALOPRAM OXALATE 10 MG PO TABS
10.0000 mg | ORAL_TABLET | Freq: Every day | ORAL | Status: DC
  Administered 2016-05-18: 10 mg via ORAL
  Filled 2016-05-16 (×2): qty 1

## 2016-05-16 NOTE — Progress Notes (Signed)
Patient ID: Roberto Watts, male   DOB: 1991/06/01, 25 y.o.   MRN: [id number]  [facility] Pt is alert and oriented x4. He reports his reason for admission is "just feeling down." Pt rates depression 2/10 and anxiety 5/10 at this time. Denies SI/HI/AVH. Pt reports that he has not been taking his medications because "I'm better without them." Pt laughs inappropriately at times during assessment and wrap up group. He appears to have no insight into his condition. He reports that his goal is "to get out." Pt refuses nighttime medications despite encouragement from Clinical research associatewriter. Treatment agreement, including group attendance and medication compliance, reviewed with pt and IVC status explained. Skin assessment performed and no contraband found. Pt has tattoos over his left chest, back, and left lower leg. Pt oriented to unit. q15 minute safety checks maintained. Pt remains free from harm. Will continue to monitor.

## 2016-05-16 NOTE — ED Provider Notes (Signed)
-----------------------------------------   7:42 AM on 05/16/2016 -----------------------------------------   Blood pressure 111/75, pulse 78, temperature 97.9 F (36.6 C), temperature source Oral, resp. rate 18, height 5\' 11"  (1.803 m), weight 160 lb (72.6 kg), SpO2 100 %.  The patient had no acute events since last update.  Calm and cooperative at this time.  Disposition is pending Psychiatry/Behavioral Medicine team recommendations.     Jeanmarie PlantJames A McShane, MD 05/16/16 [phone]

## 2016-05-16 NOTE — ED Notes (Signed)
Lunch served

## 2016-05-16 NOTE — ED Notes (Signed)
Pt awaiting telepysch.

## 2016-05-16 NOTE — ED Notes (Signed)
[facility] Pt going to room 4. Pt denies pain. Pt is Alert and Orient x 3. Pt denies SI and HI.

## 2016-05-16 NOTE — ED Notes (Signed)
Patient is sleeping, no signs of distress, q 15 min. Checks, and camera monitoring in progress.  

## 2016-05-16 NOTE — ED Notes (Signed)
Patient is alert and oriented, Patient states that He does hear voices at times, but not often, He denies Si/hi or avh at this time. q 15 min. Checks and camera monitoring in progress.

## 2016-05-16 NOTE — ED Notes (Signed)
[facility]

## 2016-05-16 NOTE — BH Assessment (Signed)
Assessment Note  Roberto Watts is an 25 y.o. male presenting to the ED under IVC, initiated by his therapist at [facility], due to concerns for depression and suicidal ideations without intent or plan.  Patient reports ongoing symptoms of depression.  He is currently attending counseling sessions but states he is not on any antidepressants.  He states he has been having thoughts of hurting but did not have any plans or intent.  Pt denies HI and any auditory/visual hallucinations.  He denies any drug/alcohol use.  Diagnosis: Depression  Past Medical History:  Past Medical History:  Diagnosis Date  . Thyroid disease     Past Surgical History:  Procedure Laterality Date  . TYMPANOSTOMY TUBE PLACEMENT      Family History: No family history on file.  Social History:  reports that he has never smoked. He has never used smokeless tobacco. He reports that he uses drugs, including Cocaine and Marijuana. He reports that he does not drink alcohol.  Additional Social History:     CIWA: CIWA-Ar BP: 122/69 Pulse Rate: 76 COWS:    Allergies:  Allergies  Allergen Reactions  . Advil [Ibuprofen] Swelling  . Penicillins Swelling    Home Medications:  (Not in a hospital admission)  OB/GYN Status:  No LMP for male patient.  General Assessment Data Location of Assessment: [facility] ED TTS Assessment: In system Is this a Tele or Face-to-Face Assessment?: Face-to-Face Is this an Initial Assessment or a Re-assessment for this encounter?: Initial Assessment Marital status: Single Maiden name: n/a Is patient pregnant?: No Pregnancy Status: No Living Arrangements: Alone Can pt return to current living arrangement?: Yes Admission Status: Involuntary Is patient capable of signing voluntary admission?: Yes Referral Source: Other Insurance type: Scientist, research (physical sciences) Exam [facility] Walk-in ONLY) Medical Exam completed: Yes  Crisis Care Plan Living Arrangements: Alone Legal Guardian:  Other: (self) Name of Psychiatrist: [facility] Name of Therapist: [facility]  Education Status Is patient currently in school?: No Current Grade: n/a Highest grade of school patient has completed: n/a Name of school: n/a Contact person: n/a  Risk to self with the past 6 months Suicidal Ideation: Yes-Currently Present Has patient been a risk to self within the past 6 months prior to admission? : No Suicidal Intent: No Has patient had any suicidal intent within the past 6 months prior to admission? : No Is patient at risk for suicide?: No Suicidal Plan?: No Has patient had any suicidal plan within the past 6 months prior to admission? : No Access to Means: No What has been your use of drugs/alcohol within the last 12 months?: None reported Previous Attempts/Gestures: No How many times?: 0 Other Self Harm Risks: None identified Triggers for Past Attempts: None known Intentional Self Injurious Behavior: None Family Suicide History: No Recent stressful life event(s): Other (Comment) Persecutory voices/beliefs?: No Depression: Yes Depression Symptoms: Loss of interest in usual pleasures, Feeling worthless/self pity Substance abuse history and/or treatment for substance abuse?: No Suicide prevention information given to non-admitted patients: Not applicable  Risk to Others within the past 6 months Homicidal Ideation: No Does patient have any lifetime risk of violence toward others beyond the six months prior to admission? : No Thoughts of Harm to Others: No Current Homicidal Intent: No Current Homicidal Plan: No Access to Homicidal Means: No Identified Victim: none identified History of harm to others?: No Assessment of Violence: None Noted Violent Behavior Description: none identified Does patient have access to weapons?: No Criminal Charges Pending?:  No Does patient have a court date: No Is patient on probation?: No  Psychosis Hallucinations: None  noted Delusions: None noted  Mental Status Report Appearance/Hygiene: In scrubs Eye Contact: Fair Motor Activity: Unremarkable, Freedom of movement Speech: Logical/coherent Level of Consciousness: Alert Mood: Depressed Affect: Appropriate to circumstance, Depressed Anxiety Level: Minimal Thought Processes: Relevant Judgement: Unimpaired Orientation: Person, Place, Time, Situation Obsessive Compulsive Thoughts/Behaviors: None  Cognitive Functioning Concentration: Normal Memory: Recent Intact, Remote Intact IQ: Average Insight: Good Impulse Control: Good Appetite: Fair Sleep: No Change Vegetative Symptoms: None  ADLScreening [place]([facility] Assessment Services) Patient's cognitive ability adequate to safely complete daily activities?: Yes Patient able to express need for assistance with ADLs?: Yes Independently performs ADLs?: Yes (appropriate for developmental age)  Prior Inpatient Therapy Prior Inpatient Therapy: No Prior Therapy Dates: n/a Prior Therapy Facilty/Provider(s): n/a Reason for Treatment: n/a  Prior Outpatient Therapy Prior Outpatient Therapy: Yes Prior Therapy Dates: current Prior Therapy Facilty/Provider(s): [facility] Reason for Treatment: depression Does patient have an ACCT team?: No Does patient have Intensive In-House Services?  : No Does patient have [facility] services? : No Does patient have P4CC services?: No  ADL Screening (condition at time of admission) Patient's cognitive ability adequate to safely complete daily activities?: Yes Patient able to express need for assistance with ADLs?: Yes Independently performs ADLs?: Yes (appropriate for developmental age)             Merchant navy officerAdvance Directives (For Healthcare) Does patient have an advance directive?: No Would patient like information on creating an advanced directive?: Yes English as a second language teacher- Educational materials given    Additional Information 1:1 In Past 12 Months?: No CIRT Risk: No Elopement Risk: No Does  patient have medical clearance?: Yes     Disposition:  Disposition Initial Assessment Completed for this Encounter: Yes Disposition of Patient: Inpatient treatment program, Other dispositions Type of inpatient treatment program: Adult Other disposition(s): Other (Comment) (Pending Psych MD review)  On Site Evaluation by:   Reviewed with Physician:    Artist Beachoxana C Xylah Early 05/16/2016 6:33 AM

## 2016-05-16 NOTE — Progress Notes (Addendum)
Patient [facility] to be [facility] Toni Amendlapacs.  Attending Physician will be Dr. Jennet MaduroPucilowska.   Patient [facility] room [facility] Keomah VillagePhyllis.   Intake Paper Work has been signed and placed on patient chart.  ER staff is aware of the admission Irving Burton( [facility]  ER MD; Toniann FailWendy Patient's Nurse & Lowanda FosterBrittany Patient Access).  Cheryl FlashNicole Bryker Fletchall, MS, NCC, LPCA Therapeutic Triage Specialist

## 2016-05-16 NOTE — ED Notes (Signed)
Patient alert and oriented, sitting in room, no signs of distress, denies Si/Hi at this times, denies Si/hi or avh. Patient with q 15 min. Checks and camera monitoring in progress at all times.

## 2016-05-16 NOTE — ED Notes (Signed)
Report given to Psych from telepsych.

## 2016-05-16 NOTE — ED Notes (Signed)
Patient is alert and oriented, He wanted to watch tv, states that He is bored, patient talked to nurse about how he went for 3 years without any problems and now He wants to get back on track. Patient is pleasant and cooperative. No signs of distress. q 15 min. Checks and camera monitoring.

## 2016-05-16 NOTE — ED Notes (Signed)
Patient noted in room. No complaints, stable, in no acute distress. Q15 minute rounds and monitoring via Security Cameras to continue.  

## 2016-05-16 NOTE — ED Notes (Signed)
Supper tray given.

## 2016-05-16 NOTE — ED Notes (Signed)
Patient's breakfast served, no signs of distress at this time.

## 2016-05-16 NOTE — ED Notes (Addendum)
Per Dr. Larae GroomsJacob's recommendation to Inpatient Psych, Continue IVC, and no medications at this time. Dr. Larae GroomsJacob's will fax over report.

## 2016-05-16 NOTE — ED Notes (Signed)
Patient is sleeping, nurse checked on him and he states that He is ok, but sleepy, states He does not sleep well at home, patient is safe, q 15 min. Checks and camera monitoring in progress.

## 2016-05-16 NOTE — Consult Note (Signed)
[facility] Face-to-Face Psychiatry Consult   Reason for Consult:  Consult for this 25-year-old man with a history of recurrent depression sent here from [facility] because of suicidal statements. Referring Physician:  [place]ane Patient Identification: Roberto Watts MRN:  [id number] Principal Diagnosis: Severe recurrent major depression with psychotic features (HCC) Diagnosis:   Patient Active Problem List   Diagnosis Date Noted  . Severe recurrent major depression with psychotic features (HCC) [F33.3] 05/16/2016    Total Time spent with patient: 1 hour  Subjective:   Roberto Watts is a 25 y.o. male patient admitted with "they must think I'm depressed".  HPI:  Patient interviewed. Chart reviewed. Patient known to me from previous encounters as well. This is a 25-year-old man with a history of mental health problems who was sent here on IVC from [facility]. He went there for evaluation and told them that he was having active suicidal thoughts. Apparently he then left there and went back home. They had become alarmed by his statements and filed commitment papers. Police picked him up from home and brought him in. Patient tells me that he feels like he wishes he were dead all of the time. Despite saying this he says that he doesn't think he is depressed. He talks about how he's upset because he doesn't have a job. Evidently he quit his job a few months ago because he felt like they were treating him badly. His description of it sounds rather paranoid. He says since then he just been staying with his parents not doing much although he claims to be looking for work. A lot. Eats a lot. Not taking any current psychiatric medicine. Had been taking Lexapro Trileptal and and in [place] injection at some point up to some months ago but then discontinued them. Patient repeats to me that he wishes he were dead all the time but denies any plan to act on it. Denies any hallucinations or psychotic symptoms. He is not a  very effective historian rather vague in his descriptions.  Social history: Patient lives with his biological parents. He is not currently working. Not going for any outpatient treatment. Says he spends most of his day doing not much of anything.  Medical history: Denies any significant known ongoing medical problems.  Substance abuse history: Patient has a past history of abuse of drugs but claims that he has not been using any alcohol or drugs anytime recently.  Past Psychiatric History: Patient has a past history of psychiatric admission and odd symptoms including symptoms of depression with paranoia and psychotic-like behavior. His insight has been poor about it. He had been treated in the past with a combination of antidepressants and antipsychotics however with pretty good response. Not clear that he's ever had a true bipolar episode. Doesn't have much insight into his paranoid and psychotic thinking.  Risk to Self: Suicidal Ideation: Yes-Currently Present Suicidal Intent: No Is patient at risk for suicide?: No Suicidal Plan?: No Access to Means: No What has been your use of drugs/alcohol within the last 12 months?: None reported How many times?: 0 Other Self Harm Risks: None identified Triggers for Past Attempts: None known Intentional Self Injurious Behavior: None Risk to Others: Homicidal Ideation: No Thoughts of Harm to Others: No Current Homicidal Intent: No Current Homicidal Plan: No Access to Homicidal Means: No Identified Victim: none identified History of harm to others?: No Assessment of Violence: None Noted Violent Behavior Description: none identified Does patient have access to weapons?: No Criminal Charges   Pending?: No Does patient have a court date: No Prior Inpatient Therapy: Prior Inpatient Therapy: No Prior Therapy Dates: n/a Prior Therapy Facilty/Provider(s): n/a Reason for Treatment: n/a Prior Outpatient Therapy: Prior Outpatient Therapy: Yes Prior  Therapy Dates: current Prior Therapy Facilty/Provider(s): [facility] Reason for Treatment: depression Does patient have an ACCT team?: No Does patient have Intensive In-House Services?  : No Does patient have [facility] services? : No Does patient have P4CC services?: No  Past Medical History:  Past Medical History:  Diagnosis Date  . Thyroid disease     Past Surgical History:  Procedure Laterality Date  . TYMPANOSTOMY TUBE PLACEMENT     Family History: No family history on file. Family Psychiatric  History: Denies knowing of any family history of any mental health problems Social History:  History  Alcohol Use No     History  Drug Use  . Types: Cocaine, Marijuana    Social History   Social History  . Marital status: Single    Spouse name: N/A  . Number of children: N/A  . Years of education: N/A   Social History Main Topics  . Smoking status: Never Smoker  . Smokeless tobacco: Never Used  . Alcohol use No  . Drug use:     Types: Cocaine, Marijuana  . Sexual activity: Not Asked   Other Topics Concern  . None   Social History Narrative  . None   Additional Social History:    Allergies:   Allergies  Allergen Reactions  . Advil [Ibuprofen] Swelling  . Penicillins Swelling    Labs:  Results for orders placed or performed during the hospital encounter of 05/15/16 (from the past 48 hour(s))  Comprehensive metabolic panel     Status: Abnormal   Collection Time: 05/15/16  3:29 PM  Result Value Ref Range   Sodium 138 135 - 145 mmol/L   Potassium 4.2 3.5 - 5.1 mmol/L   Chloride 102 101 - 111 mmol/L   CO2 26 22 - 32 mmol/L   Glucose, Bld 99 65 - 99 mg/dL   BUN 15 6 - 20 mg/dL   Creatinine, Ser 0.71 0.61 - 1.24 mg/dL   Calcium 9.8 8.9 - 10.3 mg/dL   Total Protein 7.6 6.5 - 8.1 g/dL   Albumin 4.8 3.5 - 5.0 g/dL   AST 32 15 - 41 U/L   ALT 40 17 - 63 U/L   Alkaline Phosphatase 93 38 - 126 U/L   Total Bilirubin 2.2 (H) 0.3 - 1.2 mg/dL   GFR calc non Af Amer >60  >60 mL/min   GFR calc Af Amer >60 >60 mL/min    Comment: (NOTE) The eGFR has been calculated using the CKD EPI equation. This calculation has not been validated in all clinical situations. eGFR's persistently <60 mL/min signify possible Chronic Kidney Disease.    Anion gap 10 5 - 15  Ethanol     Status: None   Collection Time: 05/15/16  3:29 PM  Result Value Ref Range   Alcohol, Ethyl (B) <5 <5 mg/dL    Comment:        LOWEST DETECTABLE LIMIT FOR SERUM ALCOHOL IS 5 mg/dL FOR MEDICAL PURPOSES ONLY   Salicylate level     Status: None   Collection Time: 05/15/16  3:29 PM  Result Value Ref Range   Salicylate Lvl <4.0 2.8 - 30.0 mg/dL  Acetaminophen level     Status: Abnormal   Collection Time: 05/15/16  3:29 PM  Result Value Ref Range     Acetaminophen (Tylenol), Serum <10 (L) 10 - 30 ug/mL    Comment:        THERAPEUTIC CONCENTRATIONS VARY SIGNIFICANTLY. A RANGE OF 10-30 ug/mL MAY BE AN EFFECTIVE CONCENTRATION FOR MANY PATIENTS. HOWEVER, SOME ARE BEST TREATED AT CONCENTRATIONS OUTSIDE THIS RANGE. ACETAMINOPHEN CONCENTRATIONS >150 ug/mL AT 4 HOURS AFTER INGESTION AND >50 ug/mL AT 12 HOURS AFTER INGESTION ARE OFTEN ASSOCIATED WITH TOXIC REACTIONS.   cbc     Status: None   Collection Time: 05/15/16  3:29 PM  Result Value Ref Range   WBC 5.0 3.8 - 10.6 K/uL   RBC 4.82 4.40 - 5.90 MIL/uL   Hemoglobin 16.1 13.0 - 18.0 g/dL    Comment: RESULT REPEATED AND VERIFIED   HCT 45.4 40.0 - 52.0 %   MCV 94.0 80.0 - 100.0 fL   MCH 33.5 26.0 - 34.0 pg   MCHC 35.6 32.0 - 36.0 g/dL   RDW 11.9 11.5 - 14.5 %   Platelets 163 150 - 440 K/uL  Urine Drug Screen, Qualitative     Status: None   Collection Time: 05/15/16  3:29 PM  Result Value Ref Range   Tricyclic, Ur Screen NONE DETECTED NONE DETECTED   Amphetamines, Ur Screen NONE DETECTED NONE DETECTED   MDMA (Ecstasy)Ur Screen NONE DETECTED NONE DETECTED   Cocaine Metabolite,Ur [place] NONE DETECTED NONE DETECTED   Opiate, Ur Screen NONE  DETECTED NONE DETECTED   Phencyclidine (PCP) Ur S NONE DETECTED NONE DETECTED   Cannabinoid 50 Ng, Ur [place] NONE DETECTED NONE DETECTED   Barbiturates, Ur Screen NONE DETECTED NONE DETECTED   Benzodiazepine, Ur Scrn NONE DETECTED NONE DETECTED   Methadone Scn, Ur NONE DETECTED NONE DETECTED    Comment: (NOTE) 676  Tricyclics, urine               Cutoff 1000 ng/mL 200  Amphetamines, urine             Cutoff 1000 ng/mL 300  MDMA (Ecstasy), urine           Cutoff 500 ng/mL 400  Cocaine Metabolite, urine       Cutoff 300 ng/mL 500  Opiate, urine                   Cutoff 300 ng/mL 600  Phencyclidine (PCP), urine      Cutoff 25 ng/mL 700  Cannabinoid, urine              Cutoff 50 ng/mL 800  Barbiturates, urine             Cutoff 200 ng/mL 900  Benzodiazepine, urine           Cutoff 200 ng/mL 1000 Methadone, urine                Cutoff 300 ng/mL 1100 1200 The urine drug screen provides only a preliminary, unconfirmed 1300 analytical test result and should not be used for non-medical 1400 purposes. Clinical consideration and professional judgment should 1500 be applied to any positive drug screen result due to possible 1600 interfering substances. A more specific alternate chemical method 1700 must be used in order to obtain a confirmed analytical result.  1800 Gas chromato graphy / mass spectrometry (GC/MS) is the preferred 1900 confirmatory method.     Current Facility-Administered Medications  Medication Dose Route Frequency Provider Last Rate Last Dose  . escitalopram (LEXAPRO) tablet 10 mg  10 mg Oral Daily Gonzella Lex, MD      . QUEtiapine (  SEROQUEL) tablet 100 mg  100 mg Oral QHS  T , MD       Current Outpatient Prescriptions  Medication Sig Dispense Refill  . benztropine (COGENTIN) 0.5 MG tablet Take 0.5 mg by mouth 2 (two) times daily.    . escitalopram (LEXAPRO) 10 MG tablet Take 10 mg by mouth daily.    . Oxcarbazepine (TRILEPTAL) 300 MG tablet Take 300 mg by  mouth 2 (two) times daily.      Musculoskeletal: Strength & Muscle Tone: within normal limits Gait & Station: normal Patient leans: N/A  Psychiatric Specialty Exam: Physical Exam  Nursing note and vitals reviewed. Constitutional: He appears well-developed and well-nourished.  HENT:  Head: Normocephalic and atraumatic.  Eyes: Conjunctivae are normal. Pupils are equal, round, and reactive to light.  Neck: Normal range of motion.  Cardiovascular: Regular rhythm and normal heart sounds.   Respiratory: Effort normal. No respiratory distress.  GI: Soft.  Musculoskeletal: Normal range of motion.  Neurological: He is alert.  Skin: Skin is warm and dry.  Psychiatric: His mood appears anxious. His affect is blunt and inappropriate. His speech is delayed. He is slowed. Thought content is paranoid. Cognition and memory are normal. He expresses impulsivity. He exhibits a depressed mood. He expresses suicidal ideation.    Review of Systems  Constitutional: Negative.   HENT: Negative.   Eyes: Negative.   Respiratory: Negative.   Cardiovascular: Negative.   Gastrointestinal: Negative.   Musculoskeletal: Negative.   Skin: Negative.   Neurological: Negative.   Psychiatric/Behavioral: Positive for depression and suicidal ideas.    Blood pressure 111/75, pulse 78, temperature 97.9 F (36.6 C), temperature source Oral, resp. rate 18, height 5' 11" (1.803 m), weight 72.6 kg (160 lb), SpO2 100 %.Body mass index is 22.32 kg/m.  General Appearance: Casual  Eye Contact:  Good  Speech:  Slow  Volume:  Decreased  Mood:  Dysphoric  Affect:  Congruent  Thought Process:  Goal Directed  Orientation:  Full (Time, Place, and Person)  Thought Content:  Paranoid Ideation  Suicidal Thoughts:  Yes.  without intent/plan  Homicidal Thoughts:  No  Memory:  Immediate;   Good Recent;   Fair Remote;   Fair  Judgement:  Fair  Insight:  Fair  Psychomotor Activity:  Decreased  Concentration:  Concentration:  Poor  Recall:  Fair  Fund of Knowledge:  Fair  Language:  Fair  Akathisia:  No  Handed:  Right  AIMS (if indicated):     Assets:  Housing Resilience Social Support  ADL's:  Intact  Cognition:  WNL  Sleep:        Treatment Plan Summary: Daily contact with patient to assess and evaluate symptoms and progress in treatment, Medication management and Plan This is a 25-year-old man with history of depression with psychotic features in the past. Currently appears to be paranoid and endorses suicidal ideation. Poor insight. Not very cooperative. His drug screen is negative. Patient is at risk to himself because of suicidality and I will pull the IVC and admit him to the psychiatric ward. Restart any antipsychotics and antidepressants. Full labs to be evaluated.  Disposition: Recommend psychiatric Inpatient admission when medically cleared. Supportive therapy provided about ongoing stressors.   , MD 05/16/2016 1:09 PM 

## 2016-05-16 NOTE — Tx Team (Signed)
Initial Treatment Plan 05/16/2016 10:59 PM Cindee LameBrandyn M Guse [id number]    PATIENT STRESSORS: Medication change or noncompliance Occupational concerns   PATIENT STRENGTHS: Average or above average intelligence Physical Health Supportive family/friends   PATIENT IDENTIFIED PROBLEMS: Depression "just feeling down"  Medication noncompliance  Suicidal ideation                 DISCHARGE CRITERIA:  Improved stabilization in mood, thinking, and/or behavior Motivation to continue treatment in a less acute level of care Need for constant or close observation no longer present Reduction of life-threatening or endangering symptoms to within safe limits Verbal commitment to aftercare and medication compliance  PRELIMINARY DISCHARGE PLAN: Attend aftercare/continuing care group Outpatient therapy Return to previous living arrangement  PATIENT/FAMILY INVOLVEMENT: This treatment plan has been presented to and reviewed with the patient, Cindee LameBrandyn M Edge, and/or family member.  The patient and family have been given the opportunity to ask questions and make suggestions.  Beckie BusingMichelle L Parsells, RN 05/16/2016, 10:59 PM

## 2016-05-16 NOTE — ED Notes (Signed)
[place] [employer] officer taking over for [employer] 

## 2016-05-16 NOTE — ED Notes (Signed)
Patient is sleeping, no signs of distress, nurse did wake him and ask if He would like to talk and He states that He had rather sleep longer. No signs of distress.

## 2016-05-17 ENCOUNTER — Encounter: Payer: Self-pay | Admitting: Psychiatry

## 2016-05-17 DIAGNOSIS — F333 Major depressive disorder, recurrent, severe with psychotic symptoms: Principal | ICD-10-CM

## 2016-05-17 DIAGNOSIS — F29 Unspecified psychosis not due to a substance or known physiological condition: Secondary | ICD-10-CM

## 2016-05-17 DIAGNOSIS — F3160 Bipolar disorder, current episode mixed, unspecified: Secondary | ICD-10-CM

## 2016-05-17 DIAGNOSIS — R45851 Suicidal ideations: Secondary | ICD-10-CM

## 2016-05-17 MED ORDER — OXCARBAZEPINE 300 MG PO TABS
300.0000 mg | ORAL_TABLET | Freq: Two times a day (BID) | ORAL | Status: DC
  Administered 2016-05-17 – 2016-05-19 (×4): 300 mg via ORAL
  Filled 2016-05-17 (×4): qty 1

## 2016-05-17 MED ORDER — OLANZAPINE 10 MG IM SOLR: 10.0000 mg | Freq: Every day | INTRAMUSCULAR | Status: DC

## 2016-05-17 MED ORDER — OLANZAPINE 5 MG PO TBDP
15.0000 mg | ORAL_TABLET | Freq: Every day | ORAL | Status: DC
  Administered 2016-05-17 – 2016-05-18 (×2): 15 mg via ORAL
  Filled 2016-05-17 (×2): qty 1

## 2016-05-17 NOTE — [facility] Group Notes (Signed)
[facility] Group Notes:  (Nursing/MHT/Case Management/Adjunct)  Date:  05/17/2016  Time:  6:11 PM  Type of Therapy:  Psychoeducational Skills  Participation Level:  Did Not Attend  Lynelle SmokeCara Travis [facility] 05/17/2016, 6:11 PM

## 2016-05-17 NOTE — Progress Notes (Signed)
[facility] Second Physician Opinion Progress Note for Medication Administration to Non-consenting Patients (For Involuntarily Committed Patients)  Patient: Roberto Watts Date of Birth: [id number]/04/1991 MRN: [id number]  Reason for the Medication: The patient, without the benefit of the specific treatment measure, is incapable of participating in any available treatment plan that will give the patient a realistic opportunity of improving the patient's condition. There is, without the benefit of the specific treatment measure, a significant possibility that the patient will harm self or others before improvement of the patient's condition is realized.  Consideration of Side Effects: Consideration of the side effects related to the medication plan has been given.  Rationale for Medication Administration: psychosis severe enough to impair functioning. Pt does not have medical capacity in order to understand need for treatment with antipsychotics    Roberto Watts,  Roberto Stender, MD 05/17/16  1:47 PM   This documentation is good for (7) seven days from the date of the MD signature. New documentation must be completed every seven (7) days with detailed justification in the medical record if the patient requires continued non-emergent administration of psychotropic medications.

## 2016-05-17 NOTE — [facility] Group Notes (Signed)
[facility] Group Notes:  (Nursing/MHT/Case Management/Adjunct)  Date:  05/17/2016  Time:  2:27 AM  Type of Therapy:  Group Therapy  Participation Level:  Active  Participation Quality:  Attentive  Affect:  Excited  Cognitive:  Oriented  Insight:  Lacking  Engagement in Group:  Lacking  Modes of Intervention:  Discussion  Summary of Progress/Problems: Pt did not have a goal because he had just gotten on the unit shortly before group. Staff observed pt laughing inappropriately. When staff asked pt what he was laughing at. Pt shrugged shoulders and continue to laugh to himself.   Fanny Skatesshley Imani Broedy Osbourne 05/17/2016, 2:27 AM

## 2016-05-17 NOTE — Progress Notes (Signed)
Patient was irritable and pacing in the hallway to get immediate discharge.Patient refused to take medicines.States"you are punishing me and I don't belong here."Denies suicidal or homicidal ideations and AV hallucinations.When patient was told about the force med order patient took the Zyprexa P.O.Patient was sleeping in the afternoon.After dinner patient was about to fall,stated that he was dizzy.Stated that medicine made him sick.Assisted patient back to bed.Fluids offered.Patient verbalized that he is not dizzy but he feels "druged".Resting in bed.

## 2016-05-17 NOTE — [facility] Group Notes (Addendum)
[facility] LCSW Group Therapy  05/17/2016 2:11 PM  Type of Therapy:  Group Therapy  Participation Level:  Minimal  Participation Quality:  Attentive  Affect:  Appropriate  Cognitive:  Alert  Insight:  Poor  Engagement in Therapy:  Limited  Modes of Intervention:  Activity, Discussion, Education and Support  Summary of Progress/Problems:Feelings around Relapse. Group members discussed the meaning of relapse and shared personal stories of relapse, how it affected them and others, and how they perceived themselves during this time. Group members were encouraged to identify triggers, warning signs and coping skills used when facing the possibility of relapse. Social supports were discussed and explored in detail. Patients also discussed facing disappointment and how that can trigger someone to relapse. Patient participated in the group discussion when prompted by the CSW. Patient stated he tends to isolate when feeling angry or upset with himself or others. CSW discussed healthy coping skills with the group.    Elnita Surprenant G. Garnette CzechSampson MSW, LCSWA 05/17/2016, 2:13 PM

## 2016-05-17 NOTE — Progress Notes (Signed)
Pt refuses AM labs and vital signs. Pt covers face when staff walks into room and refuses to respond. Pt remains free from harm. Will continue to monitor.

## 2016-05-17 NOTE — Plan of Care (Signed)
Problem: Medication: Goal: Compliance with prescribed medication regimen will improve Outcome: Progressing Patient compliant with medications.   

## 2016-05-17 NOTE — H&P (Addendum)
Psychiatric Admission Assessment Adult  Patient Identification: Roberto Watts MRN:  [id number] Date of Evaluation:  05/17/2016 Chief Complaint:  Major Depression Principal Diagnosis: Major depressive disorder, recurrent, severe with psychotic features ([place]) Diagnosis:   Patient Active Problem List   Diagnosis Date Noted  . Suicidal ideation [R45.851] 05/17/2016  . Major depressive disorder, recurrent, severe with psychotic features ([place]) [F33.3] 05/17/2016  . Schizophrenia spectrum disorder with psychotic disorder type not yet determined ([place]) [F20.89, F29] 05/17/2016  . Bipolar I disorder, most recent episode mixed ([place]) [F31.60] 05/17/2016  . 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 Mr. Roberto Watts is a 25 year old male with a history of psychotic depression.  Chief complaint. "I want out. "  History of present illness. Information is obtained from the patient, the chart and his parents. The patient was diagnosed with psychotic depression and was hospitalized at [facility] in 2013. He responded well to a combination of Lexapro and 400 mg of Seroquel. He was seen at [place] clinic for his first time evaluation. The patient appeared depressed and threatened to shoot himself. They took comittment papers out. He was brought to [facility] emergency room where he was again evaluated by Dr. Weber Watts, who is familiar with this patient, and was again given diagnosis of psychotic depression. This morning the patient presented completely differently. He is clearly attending to internal stimuli laughing and giggling inappropriately. He is irritable, intrusive and loud. He demands immediate discharge and does not feel that he belongs in the hospital. She refused medications last night and this morning. He adamantly denies any symptoms of depression, anxiety, psychosis, or symptoms suggestive of bipolar mania.  He denies alcohol or illicit substance use.   Past psychiatric history. He has been treated for depression and psychosis in the past. He responded well to a combination of Lexapro and Seroquel in 2013. For the past two months he has been on [place] sustenna injections, next on on 05/24/2016, Lexapro and trileptal prescribed by Dr. Jennelle Watts at [facility]. There were also trials on Latuda and Depakote. He had his first visit with Dr. Daryll Watts at [place] yesterday. There is a remote history of marijuana abuse but the patient has been clean of substances lately.  Family psychiatric history. Depression on mother's side. The mother is taking Cymbalta. Uncle with schizophrenia and sister with bipolar on his father's side.  Social history. He graduated from high school. He went to [employer] for a year and then to [employer] for one semester. He lives with his parents. He was employed until June by [employer].   Total Time spent with patient: 1 hour  Is the patient at risk to self? Yes.    Has the patient been a risk to self in the past 6 months? No.  Has the patient been a risk to self within the distant past? No.  Is the patient a risk to others? No.  Has the patient been a risk to others in the past 6 months? No.  Has the patient been a risk to others within the distant past? No.   Prior Inpatient Therapy:   Prior Outpatient Therapy:    Alcohol Screening: 1. How often do you have a drink containing alcohol?: Never 2. How many drinks containing alcohol do you have on a typical day when you are drinking?: 1 or 2 3. How often do you have six or  more drinks on one occasion?: Never Preliminary Score: 0 4. How often during the last year have you found that you were not able to stop drinking once you had started?: Never 5. How often during the last year have you failed to do what was normally expected from you becasue of drinking?: Never 6. How often  during the last year have you needed a first drink in the morning to get yourself going after a heavy drinking session?: Never 7. How often during the last year have you had a feeling of guilt of remorse after drinking?: Never 8. How often during the last year have you been unable to remember what happened the night before because you had been drinking?: Never 9. Have you or someone else been injured as a result of your drinking?: No 10. Has a relative or friend or a doctor or another health worker been concerned about your drinking or suggested you cut down?: No Alcohol Use Disorder Identification Test Final Score (AUDIT): 0 Brief Intervention: AUDIT score less than 7 or less-screening does not suggest unhealthy drinking-brief intervention not indicated Substance Abuse History in the last 12 months:  No. Consequences of Substance Abuse: NA Previous Psychotropic Medications: Yes  Psychological Evaluations: No  Past Medical History:  Past Medical History:  Diagnosis Date  . Medical history non-contributory     Past Surgical History:  Procedure Laterality Date  . TYMPANOSTOMY TUBE PLACEMENT     Family History: History reviewed. No pertinent family history.   Tobacco Screening: Have you used any form of tobacco in the last 30 days? (Cigarettes, Smokeless Tobacco, Cigars, and/or Pipes): No Social History:  History  Alcohol Use No     History  Drug Use  . Types: Cocaine, Marijuana    Additional Social History:      Pain Medications: denies Prescriptions: Pt not taking Over the Counter: denies History of alcohol / drug use?: Yes (per chart, hx of drug abuse)                    Allergies:   Allergies  Allergen Reactions  . Advil [Ibuprofen] Swelling  . Penicillins Swelling   Lab Results:  Results for orders placed or performed during the hospital encounter of 05/15/16 (from the past 48 hour(s))  Comprehensive metabolic panel     Status: Abnormal   Collection Time:  05/15/16  3:29 PM  Result Value Ref Range   Sodium 138 135 - 145 mmol/L   Potassium 4.2 3.5 - 5.1 mmol/L   Chloride 102 101 - 111 mmol/L   CO2 26 22 - 32 mmol/L   Glucose, Bld 99 65 - 99 mg/dL   BUN 15 6 - 20 mg/dL   Creatinine, Ser 0.71 0.61 - 1.24 mg/dL   Calcium 9.8 8.9 - 10.3 mg/dL   Total Protein 7.6 6.5 - 8.1 g/dL   Albumin 4.8 3.5 - 5.0 g/dL   AST 32 15 - 41 U/L   ALT 40 17 - 63 U/L   Alkaline Phosphatase 93 38 - 126 U/L   Total Bilirubin 2.2 (H) 0.3 - 1.2 mg/dL   GFR calc non Af Amer >60 >60 mL/min   GFR calc Af Amer >60 >60 mL/min    Comment: (NOTE) The eGFR has been calculated using the CKD EPI equation. This calculation has not been validated in all clinical situations. eGFR's persistently <60 mL/min signify possible Chronic Kidney Disease.    Anion gap 10 5 - 15  Ethanol  Status: None   Collection Time: 05/15/16  3:29 PM  Result Value Ref Range   Alcohol, Ethyl (B) <5 <5 mg/dL    Comment:        LOWEST DETECTABLE LIMIT FOR SERUM ALCOHOL IS 5 mg/dL FOR MEDICAL PURPOSES ONLY   Salicylate level     Status: None   Collection Time: 05/15/16  3:29 PM  Result Value Ref Range   Salicylate Lvl <5.4 2.8 - 30.0 mg/dL  Acetaminophen level     Status: Abnormal   Collection Time: 05/15/16  3:29 PM  Result Value Ref Range   Acetaminophen (Tylenol), Serum <10 (L) 10 - 30 ug/mL    Comment:        THERAPEUTIC CONCENTRATIONS VARY SIGNIFICANTLY. A RANGE OF 10-30 ug/mL MAY BE AN EFFECTIVE CONCENTRATION FOR MANY PATIENTS. HOWEVER, SOME ARE BEST TREATED AT CONCENTRATIONS OUTSIDE THIS RANGE. ACETAMINOPHEN CONCENTRATIONS >150 ug/mL AT 4 HOURS AFTER INGESTION AND >50 ug/mL AT 12 HOURS AFTER INGESTION ARE OFTEN ASSOCIATED WITH TOXIC REACTIONS.   cbc     Status: None   Collection Time: 05/15/16  3:29 PM  Result Value Ref Range   WBC 5.0 3.8 - 10.6 K/uL   RBC 4.82 4.40 - 5.90 MIL/uL   Hemoglobin 16.1 13.0 - 18.0 g/dL    Comment: RESULT REPEATED AND VERIFIED   HCT  45.4 40.0 - 52.0 %   MCV 94.0 80.0 - 100.0 fL   MCH 33.5 26.0 - 34.0 pg   MCHC 35.6 32.0 - 36.0 g/dL   RDW 11.9 11.5 - 14.5 %   Platelets 163 150 - 440 K/uL  Urine Drug Screen, Qualitative     Status: None   Collection Time: 05/15/16  3:29 PM  Result Value Ref Range   Tricyclic, Ur Screen NONE DETECTED NONE DETECTED   Amphetamines, Ur Screen NONE DETECTED NONE DETECTED   MDMA (Ecstasy)Ur Screen NONE DETECTED NONE DETECTED   Cocaine Metabolite,Ur [place] NONE DETECTED NONE DETECTED   Opiate, Ur Screen NONE DETECTED NONE DETECTED   Phencyclidine (PCP) Ur S NONE DETECTED NONE DETECTED   Cannabinoid 50 Ng, Ur [place] NONE DETECTED NONE DETECTED   Barbiturates, Ur Screen NONE DETECTED NONE DETECTED   Benzodiazepine, Ur Scrn NONE DETECTED NONE DETECTED   Methadone Scn, Ur NONE DETECTED NONE DETECTED    Comment: (NOTE) 650  Tricyclics, urine               Cutoff 1000 ng/mL 200  Amphetamines, urine             Cutoff 1000 ng/mL 300  MDMA (Ecstasy), urine           Cutoff 500 ng/mL 400  Cocaine Metabolite, urine       Cutoff 300 ng/mL 500  Opiate, urine                   Cutoff 300 ng/mL 600  Phencyclidine (PCP), urine      Cutoff 25 ng/mL 700  Cannabinoid, urine              Cutoff 50 ng/mL 800  Barbiturates, urine             Cutoff 200 ng/mL 900  Benzodiazepine, urine           Cutoff 200 ng/mL 1000 Methadone, urine                Cutoff 300 ng/mL 1100 1200 The urine drug screen provides only a preliminary, unconfirmed 1300 analytical test result  and should not be used for non-medical 1400 purposes. Clinical consideration and professional judgment should 1500 be applied to any positive drug screen result due to possible 1600 interfering substances. A more specific alternate chemical method 1700 must be used in order to obtain a confirmed analytical result.  1800 Gas chromato graphy / mass spectrometry (GC/MS) is the preferred 1900 confirmatory method.     Blood Alcohol level:  Lab Results   Component Value Date   ETH <5 75/64/3329    Metabolic Disorder Labs:  No results found for: HGBA1C, MPG No results found for: PROLACTIN No results found for: CHOL, TRIG, HDL, CHOLHDL, VLDL, LDLCALC  Current Medications: Current Facility-Administered Medications  Medication Dose Route Frequency Provider Last Rate Last Dose  . acetaminophen (TYLENOL) tablet 650 mg  650 mg Oral Q6H PRN Gonzella Lex, MD      . alum & mag hydroxide-simeth (MAALOX/MYLANTA) 200-200-20 MG/5ML suspension 30 mL  30 mL Oral Q4H PRN Gonzella Lex, MD      . escitalopram (LEXAPRO) tablet 10 mg  10 mg Oral Daily John T Clapacs, MD      . magnesium hydroxide (MILK OF MAGNESIA) suspension 30 mL  30 mL Oral Daily PRN Gonzella Lex, MD      . QUEtiapine (SEROQUEL) tablet 100 mg  100 mg Oral QHS Gonzella Lex, MD       PTA Medications: Prescriptions Prior to Admission  Medication Sig Dispense Refill Last Dose  . benztropine (COGENTIN) 0.5 MG tablet Take 0.5 mg by mouth 2 (two) times daily.   Not Taking at Unknown time  . escitalopram (LEXAPRO) 10 MG tablet Take 10 mg by mouth daily.   Not Taking at Unknown time  . Oxcarbazepine (TRILEPTAL) 300 MG tablet Take 300 mg by mouth 2 (two) times daily.   Not Taking at Unknown time    Musculoskeletal: Strength & Muscle Tone: within normal limits Gait & Station: normal Patient leans: N/A  Psychiatric Specialty Exam: I reviewed physical exam performed in the emergency room and agree with the findings. Physical Exam  Nursing note and vitals reviewed.   Review of Systems  Psychiatric/Behavioral: Positive for depression, hallucinations and suicidal ideas.  All other systems reviewed and are negative.   Blood pressure (!) 148/88, pulse 100, resp. rate 16, height 5' 11.65" (1.82 m), weight 73.9 kg (163 lb), SpO2 100 %.Body mass index is 22.32 kg/m.  See SRA.                                                  Sleep:  Number of Hours: 6.5        Treatment Plan Summary: Daily contact with patient to assess and evaluate symptoms and progress in treatment and Medication management   Mr. Kreis is a 25 year old male with a history of psychotic depression admitted for worsening of his symptoms with depression, suicidal ideation and psychosis in the context of treatment noncompliance.  1. Suicidal ideation. The patient is able to contract for safety on the unit.  2. Mood and psychosis. He was restarted on Lexapro and Trilepta. We added Seroquel that was helpful in the past but the patient refused.  3. Treatment noncompliance. The patient refused medications last night and today and demands discharge. We will initiate emergency forced medication protocol and start Zyprexa Zydis 15 mg or Zyprexa  im 10 mg if oral Zyprexa is refused.  4. Family contact. I spoke with his parents on the phone extensively.   5. Metabolic syndrome monitoring. Lipid profile and hemoglobin A1c were ordered but the patient refused.   6. Disposition. He will be discharged to home with his parents. He will follow up with [facility].  Observation Level/Precautions:  15 minute checks  Laboratory:  CBC Chemistry Profile UDS UA  Psychotherapy:    Medications:    Consultations:    Discharge Concerns:    Estimated LOS:  Other:     I certify that inpatient services furnished can reasonably be expected to improve the patient's condition.    Orson Slick, MD 9/1/20178:13 AM

## 2016-05-17 NOTE — [facility] Suicide Risk Assessment (Signed)
[facility]   Nursing information obtained from:  Patient, Review of record Demographic factors:  Male, Adolescent or [place] adult, Caucasian, Unemployed Current Mental Status:  Suicide plan, Suicidal ideation indicated by patient Loss Factors:  Decrease in vocational status Historical Factors:  NA Risk Reduction Factors:  Living with another person, especially a relative, Positive social support  Total Time spent with patient: 1 hour Principal Problem: Major depressive disorder, recurrent, severe with psychotic features ([facility]) Diagnosis:   Patient Active Problem List   Diagnosis Date Noted  . Suicidal ideation [R45.851] 05/17/2016  . 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 Schizophrenia spectrum disorder with psychotic disorder type not yet determined ([facility]) [F20.89, F29] 05/17/2016  . Bipolar I disorder, most recent episode mixed ([facility]) [F31.60] 05/17/2016  . 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  Continued Clinical Symptoms:  Alcohol Use Disorder Identification Test Final Score (AUDIT): 0 The "Alcohol Use Disorders Identification Test", Guidelines for Use in Primary Care, Second Edition.  World Science writerHealth Organization [facility](WHO). Score between 0-7:  no or low risk or alcohol related problems. Score between 8-15:  moderate risk of alcohol related problems. Score between 16-19:  high risk of alcohol related problems. Score 20 or above:  warrants further diagnostic evaluation for alcohol dependence and treatment.   CLINICAL FACTORS:   Depression:   Impulsivity Currently Psychotic   Musculoskeletal: Strength & Muscle Tone: within normal limits Gait & Station: normal Patient leans: N/A  Psychiatric Specialty Exam: Physical Exam  Nursing note and vitals reviewed.   Review of Systems  Psychiatric/Behavioral: Positive for depression,  hallucinations and suicidal ideas.  All other systems reviewed and are negative.   Blood pressure (!) 148/88, pulse 100, resp. rate 16, height 5' 11.65" (1.82 m), weight 73.9 kg (163 lb), SpO2 100 %.Body mass index is 22.32 kg/m.  General Appearance: Casual  Eye Contact:  Good  Speech:  Clear and Coherent  Volume:  Normal  Mood:  Euphoric  Affect:  Inappropriate  Thought Process:  Disorganized  Orientation:  Full (Time, Place, and Person)  Thought Content:  Delusions, Hallucinations: Auditory and Paranoid Ideation  Suicidal Thoughts:  Yes.  with intent/plan  Homicidal Thoughts:  No  Memory:  Immediate;   Fair Recent;   Fair Remote;   Fair  Judgement:  Poor  Insight:  Lacking  Psychomotor Activity:  Increased  Concentration:  Concentration: Fair and Attention Span: Fair  Recall:  [employer]iservFair  Fund of Knowledge:  Fair  Language:  Fair  Akathisia:  No  Handed:  Right  AIMS (if indicated):     Assets:  Communication Skills Desire for Improvement Financial Resources/Insurance Housing Physical Health Resilience Social Support  ADL's:  Intact  Cognition:  WNL  Sleep:  Number of Hours: 6.5      COGNITIVE FEATURES THAT CONTRIBUTE TO RISK:  None    SUICIDE RISK:   Moderate:  Frequent suicidal ideation with limited intensity, and duration, some specificity in terms of plans, no associated intent, good self-control, limited dysphoria/symptomatology, some risk factors present, and identifiable protective factors, including available and accessible social support.   PLAN OF CARE: Hospital admission, medication management, discharge planning.  Roberto Watts is a 25 year old male with a history of psychotic depression admitted for worsening of his symptoms with depression, suicidal ideation and psychosis in the context of treatment noncompliance.  1. Suicidal ideation. The patient is able to contract for safety on  the unit.  2. Mood and psychosis. He was started on Lexapro and Seroquel  that were helpful in the past. He refused his medications last night.  3. Diagnostic clarification. The patient was seen by 2 psychiatrists. They both gave diagnosis of depression with psychotic features however today the patient presents differently. He is irritable, exuberant, intrusive and clearly responding to internal stimuli. In addition to treatment with Seroquel 2 years ago at our hospital he was treated with [place] injections in the community suggesting of psychosis as a primary diagnosis.  4. Disposition. He will be discharged to home with his parents. He will follow up with [facility].  I certify that inpatient services furnished can reasonably be expected to improve the patient's condition.  Kristine Linea, MD 05/17/2016, 8:06 AM

## 2016-05-18 ENCOUNTER — Inpatient Hospital Stay: Payer: BLUE CROSS/BLUE SHIELD

## 2016-05-18 DIAGNOSIS — I4581 Long QT syndrome: Secondary | ICD-10-CM

## 2016-05-18 LAB — COMPREHENSIVE METABOLIC PANEL
ALT: 40 U/L (ref 17–63)
AST: 27 U/L (ref 15–41)
Albumin: 4 g/dL (ref 3.5–5.0)
Alkaline Phosphatase: 88 U/L (ref 38–126)
Anion gap: 3 — ABNORMAL LOW (ref 5–15)
BUN: 18 mg/dL (ref 6–20)
CHLORIDE: 106 mmol/L (ref 101–111)
CO2: 27 mmol/L (ref 22–32)
CREATININE: 0.76 mg/dL (ref 0.61–1.24)
Calcium: 8.8 mg/dL — ABNORMAL LOW (ref 8.9–10.3)
Glucose, Bld: 141 mg/dL — ABNORMAL HIGH (ref 65–99)
POTASSIUM: 4.8 mmol/L (ref 3.5–5.1)
Sodium: 136 mmol/L (ref 135–145)
TOTAL PROTEIN: 6.5 g/dL (ref 6.5–8.1)
Total Bilirubin: 1.6 mg/dL — ABNORMAL HIGH (ref 0.3–1.2)

## 2016-05-18 LAB — CBC
HCT: 39.9 % — ABNORMAL LOW (ref 40.0–52.0)
Hemoglobin: 14.4 g/dL (ref 13.0–18.0)
MCH: 34 pg (ref 26.0–34.0)
MCHC: 36.2 g/dL — ABNORMAL HIGH (ref 32.0–36.0)
MCV: 93.8 fL (ref 80.0–100.0)
Platelets: 152 10*3/uL (ref 150–440)
RBC: 4.25 MIL/uL — ABNORMAL LOW (ref 4.40–5.90)
RDW: 11.9 % (ref 11.5–14.5)
WBC: 4.7 10*3/uL (ref 3.8–10.6)

## 2016-05-18 MED ORDER — HALOPERIDOL 2 MG PO TABS
2.0000 mg | ORAL_TABLET | Freq: Every day | ORAL | Status: DC
  Administered 2016-05-18: 2 mg via ORAL
  Filled 2016-05-18: qty 1

## 2016-05-18 MED ORDER — DULOXETINE HCL 30 MG PO CPEP
30.0000 mg | ORAL_CAPSULE | Freq: Every day | ORAL | Status: DC
  Administered 2016-05-19: 30 mg via ORAL
  Filled 2016-05-18: qty 1

## 2016-05-18 MED ORDER — SODIUM CHLORIDE 0.9 % IV BOLUS (SEPSIS)
1000.0000 mL | Freq: Once | INTRAVENOUS | Status: AC
  Administered 2016-05-18: 1000 mL via INTRAVENOUS

## 2016-05-18 NOTE — [facility] Group Notes (Signed)
[facility] Group Notes:  (Nursing/MHT/Case Management/Adjunct)  Date:  05/18/2016  Time:  5:43 AM  Type of Therapy:  Psychoeducational Skills  Participation Level:  Did Not Attend  Summary of Progress/Problems:  Roberto MilroyLaquanda Watts Roberto Watts 05/18/2016, 5:43 AM

## 2016-05-18 NOTE — Consult Note (Signed)
[facility] [place] [facility]   PATIENT NAME: Roberto Watts    MR#:  [id number]  DATE OF BIRTH:  [birth year]  DATE OF ADMISSION:  05/16/2016  PRIMARY CARE PHYSICIAN: Shelda Palim McGrath, MD   REQUESTING/REFERRING PHYSICIAN: kapur  CHIEF COMPLAINT:  Hypertension, fall  HISTORY OF PRESENT ILLNESS:  Roberto Watts  is a 25 y.o. male with a known history of Bipolar disorder type I, schizophrenia who was originally admitted 05/17/2016 to the behavioral health unit for psychotic depression. He is brought to the emergency department on 05/15/2016 for suicidal ideation, started on Zyprexa 8/31. Today while walking to lunch he stood up and felt lightheaded and had fall with head trauma. Blood pressures taken noted to be hypotensive 60s over 40s stat consult for medicine services. Patient denies any complaints at this time  PAST MEDICAL HISTORY:   Past Medical History:  Diagnosis Date  . Medical history non-contributory     PAST SURGICAL HISTOIRY:   Past Surgical History:  Procedure Laterality Date  . TYMPANOSTOMY TUBE PLACEMENT      SOCIAL HISTORY:   Social History  Substance Use Topics  . Smoking status: Never Smoker  . Smokeless tobacco: Never Used  . Alcohol use No    FAMILY HISTORY:  History reviewed. No pertinent family history.  DRUG ALLERGIES:   Allergies  Allergen Reactions  . Advil [Ibuprofen] Swelling  . Penicillins Swelling    REVIEW OF SYSTEMS:  CONSTITUTIONAL: No fever, fatigue or weakness.  EYES: No blurred or double vision.  EARS, NOSE, AND THROAT: No tinnitus or ear pain.  RESPIRATORY: No cough, shortness of breath, wheezing or hemoptysis.  CARDIOVASCULAR: No chest pain, orthopnea, edema.  GASTROINTESTINAL: No nausea, vomiting, diarrhea or abdominal pain.  GENITOURINARY: No dysuria, hematuria.  ENDOCRINE: No polyuria, nocturia,  HEMATOLOGY: No anemia, easy bruising or bleeding SKIN: No rash or lesion. MUSCULOSKELETAL: No joint  pain or arthritis.   NEUROLOGIC: No tingling, numbness, weakness.  PSYCHIATRY: No anxiety or depression.   MEDICATIONS AT HOME:   Prior to Admission medications   Medication Sig Start Date End Date Taking? Authorizing Provider  benztropine (COGENTIN) 0.5 MG tablet Take 0.5 mg by mouth 2 (two) times daily.    Historical Provider, MD  escitalopram (LEXAPRO) 10 MG tablet Take 10 mg by mouth daily.    Historical Provider, MD  Oxcarbazepine (TRILEPTAL) 300 MG tablet Take 300 mg by mouth 2 (two) times daily.    Historical Provider, MD      VITAL SIGNS:  Blood pressure 114/86, pulse 81, temperature 98.3 F (36.8 C), temperature source Oral, resp. rate 18, height 5' 11.65" (1.82 m), weight 73.9 kg (163 lb), SpO2 100 %.  Orthostatic vital signs systolic blood pressure 120 lying 114 standing  PHYSICAL EXAMINATION:  GENERAL:  [age] y.o.-year-old patient lying in the bed with no acute distress.  EYES: Pupils equal, round, reactive to light and accommodation. No scleral icterus. Extraocular muscles intact.  HEENT: Head atraumatic, normocephalic. Oropharynx and nasopharynx clear.  NECK:  Supple, no jugular venous distention. No thyroid enlargement, no tenderness.  LUNGS: Normal breath sounds bilaterally, no wheezing, rales,rhonchi or crepitation. No use of accessory muscles of respiration.  CARDIOVASCULAR: S1, S2 normal. No murmurs, rubs, or gallops.  ABDOMEN: Soft, nontender, nondistended. Bowel sounds present. No organomegaly or mass.  EXTREMITIES: No pedal edema, cyanosis, or clubbing.  NEUROLOGIC: Cranial nerves II through XII are intact. Muscle strength 5/5 in all extremities. Sensation intact. Gait not checked.  PSYCHIATRIC: The patient is  alert and oriented x 3.  SKIN: No obvious rash, lesion, or ulcer.   LABORATORY PANEL:   CBC  Recent Labs Lab 05/15/16 1529  WBC 5.0  HGB 16.1  HCT 45.4  PLT 163    ------------------------------------------------------------------------------------------------------------------  Chemistries   Recent Labs Lab 05/15/16 1529  NA 138  K 4.2  CL 102  CO2 26  GLUCOSE 99  BUN 15  CREATININE 0.71  CALCIUM 9.8  AST 32  ALT 40  ALKPHOS 93  BILITOT 2.2*   ------------------------------------------------------------------------------------------------------------------  Cardiac Enzymes No results for input(s): TROPONINI in the last 168 hours. ------------------------------------------------------------------------------------------------------------------  RADIOLOGY:  Ct Head Wo Contrast  Result Date: 05/18/2016 CLINICAL DATA:  25 year old male status post fall while walking, head injury against wall. Hypotension at the time. Initial encounter. EXAM: CT HEAD WITHOUT CONTRAST TECHNIQUE: Contiguous axial images were obtained from the base of the skull through the vertex without intravenous contrast. COMPARISON:  None. FINDINGS: Brain: Cerebral volume is normal. No midline shift, ventriculomegaly, mass effect, evidence of mass lesion, intracranial hemorrhage or evidence of cortically based acute infarction. Gray-white matter differentiation is within normal limits throughout the brain. Vascular: No suspicious intracranial vascular hyperdensity. Skull: Calvarium intact.  No osseous abnormality identified. Sinuses/Orbits: Clear. Other: No scalp hematoma identified. Visualized orbit soft tissues are within normal limits. IMPRESSION: Normal non contrast appearance of the brain. No acute traumatic injury identified. Electronically Signed   By: Odessa Fleming M.D.   On: 05/18/2016 13:08    EKG:   Orders placed or performed during the hospital encounter of 05/16/16  . EKG 12-Lead  . EKG 12-Lead  . EKG 12-Lead  . EKG 12-Lead  . EKG 12-Lead  . EKG 12-Lead    IMPRESSION AND PLAN:   25 year old Caucasian gentleman admitted to the behavioral health unit for  suicidal ideation who suffered a fall and hypotension  1. Hypotension: The lead is not meet orthostatic criteria his blood pressure did drop 14 points when going from the lying to standing position I feel this is rechecked he may meet criteria periodically throughout the day. This is likely combination of dehydration as well as starting Zyprexa. Plan to provide IV fluid hydration and fecal bolus then discontinue IV, stop Zyprexa-case discussed with psychiatry attending  2. Fall/head trauma: CT head checked no acute abnormalities 3. Suicidal ideation: Care per psychiatry    All the records are reviewed and case discussed with Consulting provider. Management plans discussed with the patient, family and they are in agreement.  CODE STATUS: Full  TOTAL TIME TAKING CARE OF THIS PATIENT: 33 minutes.    Albie Arizpe,  Mardi Mainland.D on 05/18/2016 at 1:21 PM  Between 7am to 6pm - Pager - [phone]  After 6pm: House Pager: - [phone]  [facility] [place] [facility]  Office  [phone]  CC: Primary care Physician: Shelda Pal, MD

## 2016-05-18 NOTE — [facility] Suicide Risk Assessment (Signed)
[facility] INPATIENT:  Family/Significant Other Suicide Prevention Education  Suicide Prevention Education:  Education Completed; Leonard DowningJohn Gravois (father) [phone], has been identified by the patient as the family member/significant other with whom the patient will be residing, and identified as the person(s) who will aid the patient in the event of a mental health crisis (suicidal ideations/suicide attempt).  With written consent from the patient, the family member/significant other has been provided the following suicide prevention education, prior to the and/or following the discharge of the patient.  The suicide prevention education provided includes the following:  Suicide risk factors  Suicide prevention and interventions  National Suicide Hotline telephone number  [facility] assessment telephone number  [facility] Emergency Assistance 911  [facility] and/or Residential Mobile Crisis Unit telephone number  Request made of family/significant other to:  Remove weapons (e.g., guns, rifles, knives), all items previously/currently identified as safety concern.    Remove drugs/medications (over-the-counter, prescriptions, illicit drugs), all items previously/currently identified as a safety concern.  The family member/significant other verbalizes understanding of the suicide prevention education information provided.  The family member/significant other agrees to remove the items of safety concern listed above.  Bellamia Ferch G. Garnette CzechSampson MSW, LCSWA 05/18/2016, 4:55 PM

## 2016-05-18 NOTE — [facility] Counselor (Signed)
*  Late Entry  CSW attempted PSA but pt was eating and then later was sleeping and asked to complete following day.   Dorothe PeaJonathan F. Naela Nodal, LCSWA, LCAS  05/17/16

## 2016-05-18 NOTE — Progress Notes (Signed)
Patient continues to be unhappy about being in the hospital.Patient stated that he does not like the medicines but he took it.Writer saw patient walking in the hallway loosing his balance.Tried to stop the patient and make him sit on the floor.Patient states "the medicines kills me" and pulled forward,hit his head and sit on his knees.Made tha patient comfortable on the floor.VS checked.BP 64/36.Patient alert & oriented.Patient got bruises on his left eye brow.Dr.Kaput was present at the site.Patient is taken to his bed in wheelchair.

## 2016-05-18 NOTE — Progress Notes (Addendum)
[facility] MD Progress Note  05/18/2016 10:44 AM Roberto Watts  MRN:  [id number] Principal Problem: Suicidal thoughts and psychosis   History of Present Illness The patient has been more agreeable and compliant with medications and is expressing some insight this morning. Yesterday he was irritable and pacing the hallways but did take Zyprexa last night. He did sleep 9 hours and vital signs have been stable. He is somewhat lethargic and tired from the medications last night but feels like the medication is making more relaxed. He is not irritable or having any behavioral disturbances this morning. He denies any current active or passive suicidal thoughts and cannot recall triggers for suicidal thoughts prior to admission. He does admit to a history of an overdose by drugs when he was in college. He remembers having auditory hallucinations and visual hallucinations prior to admission but cannot identify what the voices were saying. He denies any current psychotic symptoms but was also very lethargic and not really wanting to talk a lot to this Clinical research associate. He was very negative and pessimistic when talking about his mental illness but did acknowledge that the medications had helped him to feel better this morning. He did not attend groups this morning and has been reclusive to his room. He does not appear to be responding to internal stimuli. Appetite is fairly good. Vital signs have been stable and he did sleep well last night. No somatic complaints.  Past psychiatric history. He has been treated for depression and psychosis in the past. He responded well to a combination of Lexapro and Seroquel in 2013. For the past two months he has been on [place] sustenna injections, next on on 05/24/2016, Lexapro and trileptal prescribed by Dr. Halina Maidens at [facility]. There were also trials on Latuda and Depakote. He had his first visit with Dr. Rollene Rotunda at [facility] yesterday. There is a remote history of marijuana  abuse but the patient has been clean of substances lately.   Family psychiatric history. Depression on mother's side. The mother is taking Cymbalta. Uncle with schizophrenia and sister with bipolar on his father's side.   Social history. He was raised by both biological parents and graduated from high school. He dropped out of college in the first semester secondary to drugs. He is currently staying with both of his parents in the [place] area. He was employed by [employer] supply until June.    Diagnosis:   Patient Active Problem List   Diagnosis Date Noted  . Suicidal ideation [R45.851] 05/17/2016    Priority: High  . Schizophrenia spectrum disorder with psychotic disorder type not yet determined (HCC) [F20.89, F29] 05/17/2016    Priority: High  . 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 Bipolar I disorder, most recent episode mixed (HCC) [F31.60] 05/17/2016  . 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 Medical history non-contributory     Past Surgical History:  Procedure Laterality Date  . TYMPANOSTOMY TUBE PLACEMENT     Family History: History reviewed. No pertinent family history.  Social History:  History  Alcohol Use No     History  Drug Use  . Types: Cocaine, Marijuana    Social History   Social History  . Marital status: Single    Spouse name: N/A  . Number of children: N/A  . Years of education:  N/A   Social History Main Topics  . Smoking status: Never Smoker  . Smokeless tobacco: Never Used  . Alcohol use No  . Drug use:     Types: Cocaine, Marijuana  . Sexual activity: No   Other Topics Concern  . None   Social History Narrative  . None   Additional Social History:    Pain Medications: denies Prescriptions: Pt not taking Over the Counter: denies History of  alcohol / drug use?: Yes (per chart, hx of drug abuse)                    Sleep: Good  Appetite:  Fair  Current Medications: Current Facility-Administered Medications  Medication Dose Route Frequency Provider Last Rate Last Dose  . acetaminophen (TYLENOL) tablet 650 mg  650 mg Oral Q6H PRN Audery Amel, MD      . alum & mag hydroxide-simeth (MAALOX/MYLANTA) 200-200-20 MG/5ML suspension 30 mL  30 mL Oral Q4H PRN Audery Amel, MD      . escitalopram (LEXAPRO) tablet 10 mg  10 mg Oral Daily Audery Amel, MD   10 mg at 05/18/16 0841  . magnesium hydroxide (MILK OF MAGNESIA) suspension 30 mL  30 mL Oral Daily PRN Audery Amel, MD      . OLANZapine (ZYPREXA) injection 10 mg  10 mg Intramuscular Daily Jolanta B Pucilowska, MD      . OLANZapine zydis (ZYPREXA) disintegrating tablet 15 mg  15 mg Oral Daily Jolanta B Pucilowska, MD   15 mg at 05/18/16 0841  . Oxcarbazepine (TRILEPTAL) tablet 300 mg  300 mg Oral BID Shari Prows, MD   300 mg at 05/18/16 0841    Lab Results: No results found for this or any previous visit (from the past 48 hour(s)).  Blood Alcohol level:  Lab Results  Component Value Date   ETH <5 05/15/2016    Metabolic Disorder Labs: No results found for: HGBA1C, MPG No results found for: PROLACTIN No results found for: CHOL, TRIG, HDL, CHOLHDL, VLDL, LDLCALC  Physical Findings: AIMS: Facial and Oral Movements Muscles of Facial Expression: None, normal Lips and Perioral Area: None, normal Jaw: None, normal Tongue: None, normal,Extremity Movements Upper (arms, wrists, hands, fingers): None, normal Lower (legs, knees, ankles, toes): None, normal, Trunk Movements Neck, shoulders, hips: None, normal, Overall Severity Severity of abnormal movements (highest score from questions above): None, normal Incapacitation due to abnormal movements: None, normal Patient's awareness of abnormal movements (rate only patient's report): No Awareness, Dental  Status Current problems with teeth and/or dentures?: No Does patient usually wear dentures?: No  CIWA:    COWS:     Musculoskeletal: Strength & Muscle Tone: within normal limits Gait & Station: normal Patient leans: N/A  Psychiatric Specialty Exam: Physical Exam  Review of Systems  Constitutional: Positive for malaise/fatigue. Negative for chills, diaphoresis, fever and weight loss.  HENT: Negative.  Negative for hearing loss, sore throat and tinnitus.   Eyes: Negative.  Negative for blurred vision, double vision and photophobia.  Respiratory: Negative.  Negative for cough, hemoptysis, sputum production, shortness of breath and wheezing.   Cardiovascular: Negative.  Negative for chest pain, palpitations and orthopnea.  Gastrointestinal: Negative for abdominal pain, constipation, diarrhea, heartburn, nausea and vomiting.  Genitourinary: Negative.  Negative for dysuria, frequency and urgency.  Musculoskeletal: Negative.  Negative for back pain, myalgias and neck pain.  Skin: Negative.  Negative for itching and rash.  Neurological: Negative.  Negative for dizziness, tingling,  tremors, sensory change, speech change, focal weakness, weakness and headaches.  Endo/Heme/Allergies: Negative.  Does not bruise/bleed easily.    Blood pressure 114/86, pulse 81, temperature 98.3 F (36.8 C), temperature source Oral, resp. rate 18, height 5' 11.65" (1.82 m), weight 73.9 kg (163 lb), SpO2 100 %.Body mass index is 22.32 kg/m.  General Appearance: Disheveled  Eye Contact:  Poor  Speech:  Slow  Volume:  Decreased  Mood:  Depressed  Affect:  Blunt  Thought Process:  Linear  Orientation:  Full (Time, Place, and Person)  Thought Content:  No hallucinations this mornig. Logicl but brief and vague responses  Suicidal Thoughts:  No  Homicidal Thoughts:  No  Memory:  Immediate;   Fair Recent;   Fair Remote;   Fair  Judgement:  Poor  Insight:  Lacking  Psychomotor Activity:  Decreased   Concentration:  Concentration: Fair and Attention Span: Fair  Recall:  Good  Fund of Knowledge:  Good  Language:  Good  Akathisia:  No  Handed:  Right  AIMS (if indicated):     Assets:  Housing Physical Health Social Support Vocational/Educational  ADL's:  Intact  Cognition:  Impaired,  Mild  Sleep:  Number of Hours: 9     Treatment Plan Summary:  Roberto Watts is a 25 year old male with a history of psychotic depression admitted for worsening of his symptoms with depression, suicidal ideation and psychosis in the context of treatment noncompliance.   1. Suicidal ideation. The patient is able to contract for safety on the unit.   2. 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 We'll plan to continue Trileptal 300 mg by mouth twice a dayHfor additional mood stabilization. He is also on Lexapro 10 mg by mouth nightly for insomnia. Seroquel was discontinued as he is now on Zyprexa. Will get baseline EKG to rule out QTc prolongation.  3) History of Xanax and Alcohol Abuse: Toxicology screen was negative the time of admission. He was advised to abstain from alcohol and all illicit drugs as they may worsen mood symptoms. The patient should be maintained in an outpatient substance abuse treatment program or at least NA/AA   4. Treatment noncompliance. The patient refused medications  several days ago and was placed on an emergency forced medication protocol. The patient may benefit from a long-acting injection of the time of discharge if he continues to be assistant to taking oral medications.   5. Family contact. Dr. Jennet MaduroPucilowska spoke with the patient's parents on the phone.    6. Metabolic syndrome monitoring. Lipid profile and hemoglobin A1c were ordered but the patient refused.  Will try to redraw labs.   7. Disposition. He will be discharged to home  with his parents. [facility] management .    Daily contact with patient to assess and evaluate symptoms and progress in treatment and Medication management  Levora AngelKAPUR,Toren Tucholski KAMAL, MD 05/18/2016, 10:44 AM

## 2016-05-18 NOTE — Progress Notes (Signed)
Safety maintained with 1:1 sitter.

## 2016-05-18 NOTE — Progress Notes (Signed)
Zyprexa was discontinued but will give Haldol 2mg  po nightly and hold off on any heavier antipsychotic medication for now.

## 2016-05-18 NOTE — [facility] Group Notes (Signed)
[facility] LCSW Group Therapy  05/18/2016 4:42 PM  Type of Therapy:  Group Therapy  Participation Level:  Patient did not attend group. CSW invited patient to group.   Summary of Progress/Problems:Protective Factors: Patients defined protective factors and discussed the importance recognizing their personal strengths. Patients identified their own protective factors and resiliency. Patients discussed building upon those protective factors to improve their ability to cope with life challenges.    Kalon Erhardt G. Garnette CzechSampson MSW, LCSWA 05/18/2016, 4:42 PM

## 2016-05-18 NOTE — Progress Notes (Signed)
1:1 sitter with patient.IV fluids started.Patient verbalized "sore"on his head.Reassurence & support given.

## 2016-05-18 NOTE — BHH Counselor (Signed)
Adult Comprehensive Assessment  Patient ID: Roberto Watts, male   DOB: 1990/11/09, 25 y.o.   MRN: [id number]  Information Source: Information source:  (Information was obtained from patients parents. Patient was unwilling to complete assessment  with CSW but provided consent to CSW to speak with parents. )  Current Stressors:  Educational / Learning stressors: n/a Employment / Job issues: unemployed Family Relationships: Patient has a good relationship with his father.  Financial / Lack of resources (include bankruptcy): Patient has no source of income.  Housing / Lack of housing: Patient is currently living with his parents.  Physical health (include injuries & life threatening diseases): n/a Social relationships: Patient isolates himself and does not communicate with others.  Substance abuse: Patient has history of substance abuse which is now in remission.  Bereavement / Loss: n/a  Living/Environment/Situation:  Living Arrangements: Parent Living conditions (as described by patient or guardian): "Good" How long has patient lived in current situation?: About 5 years.  What is atmosphere in current home: Supportive  Family History:  Marital status: Single Are you sexually active?:  (unknown) What is your sexual orientation?: unknown Has your sexual activity been affected by drugs, alcohol, medication, or emotional stress?: n/a Does patient have children?: No  Childhood History:  By whom was/is the patient raised?: Both parents Additional childhood history information: Parents reports patient was extremely hyper when he was a child. Parents reports that patient was bullied when he was growing up.  Description of patient's relationship with caregiver when they were a child: Patient and mother did not have the best relationship.  Patient's description of current relationship with people who raised him/her: Patient's parents are supportive.  How were you disciplined when you got in  trouble as a child/adolescent?: n/a Does patient have siblings?: Yes Number of Siblings: 1 Description of patient's current relationship with siblings: 1 younger brother.  Did patient suffer any verbal/emotional/physical/sexual abuse as a child?: No Did patient suffer from severe childhood neglect?: No Has patient ever been sexually abused/assaulted/raped as an adolescent or adult?: No Was the patient ever a victim of a crime or a disaster?: No Witnessed domestic violence?: No Has patient been effected by domestic violence as an adult?: No  Education:  Highest grade of school patient has completed: 2 years of college. Currently a student?: No Name of school: n/a Learning disability?: No  Employment/Work Situation:   Employment situation: Unemployed Patient's job has been impacted by current illness: Yes Describe how patient's job has been impacted: Patient has been unable to cope with depression and anxiety. Patient became increasingly paranoid and felt people were talking about him.  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?: No Are There Guns or Other Weapons in Your Home?: No Are These Comptroller?:  (n/a)  Financial Resources:   Financial resources: Support from parents / caregiver Does patient have a Lawyer or guardian?: No  Alcohol/Substance Abuse:   What has been your use of drugs/alcohol within the last 12 months?: Parents reports patient has smoked marijuana.  If attempted suicide, did drugs/alcohol play a role in this?: No Alcohol/Substance Abuse Treatment Hx: Past Tx, Outpatient If yes, describe treatment: n/a Has alcohol/substance abuse ever caused legal problems?: No  Social Support System:   Patient's Community Support System: Good Describe  Community Support System: Patietn has support from parents.  Type of faith/religion: n/a How does patient's faith help to cope with current illness?: n/a  Leisure/Recreation:   Leisure and Hobbies: Exercise, drawing, playing sports.   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 Patient presented to the hospital with suicidal thoughts, depression, anxiety,  Irritability, and psychosis. Primary triggers for admission were not taking medications, worsening depression, and isolation. [facility]  Patient will benefit from crisis stabilization, medication evaluation, group therapy and psycho education in addition to case management for discharge. At discharge, it is recommended that patient remain compliant with established discharge plan and continued treatment.    Saraia Platner G. Garnette CzechSampson MSW, [facility]WA  05/18/2016 4:54 PM

## 2016-05-18 NOTE — Progress Notes (Signed)
Patient assisted to bathroom.Verbalized that he is little dizzy.Safety maintained with 1:1 sitter.

## 2016-05-18 NOTE — Plan of Care (Signed)
Problem: Safety: Goal: Ability to remain free from injury will improve Outcome: Progressing Patient is with 1:1 sitter.

## 2016-05-18 NOTE — Progress Notes (Addendum)
Notified by nursing that the patient was walking towards his room after having lunch and fell and the left side of his head. There was a brief loss of consciousness per the patient. He first got to his knees and then hit his head against the wall. Blood pressure was low initially in the 60s of her 30s but then increased to 90s over 50s. Medicine consult was called stat and case discussed with Dr. Laural BenesJohnson. We'll check CBC, CMP, Mg. Phosph and EKG. He did report problems with lightheadedness and dizziness prior to the fall. Will discontinue Zyprexa for now secondary to anticholinergic effects of the medication possibly causing problems with dizziness. Will place one-to-one sitter with the patient for fall risk. Wi'll also check CT of the head to R/O any bleed.  Will check orthostatic vital signs. Patients father contacted and notified of the fall as well. Nursing administrator notified.He will receive IV Bolus of fluids STAT due to hypotension.  Appreciate assistance from medicine.

## 2016-05-18 NOTE — Progress Notes (Signed)
Patient talked to his father.Appetite good.Safety maintained with sitter.

## 2016-05-18 NOTE — Progress Notes (Signed)
Patient resting with eyes closed; 1:1 sitter at bedside.

## 2016-05-19 ENCOUNTER — Encounter: Payer: Self-pay | Admitting: Internal Medicine

## 2016-05-19 ENCOUNTER — Observation Stay
Admission: AD | Admit: 2016-05-19 | Discharge: 2016-05-22 | Payer: BLUE CROSS/BLUE SHIELD | Source: Ambulatory Visit | Attending: Internal Medicine | Admitting: Internal Medicine

## 2016-05-19 DIAGNOSIS — E039 Hypothyroidism, unspecified: Secondary | ICD-10-CM | POA: Insufficient documentation

## 2016-05-19 DIAGNOSIS — E2740 Unspecified adrenocortical insufficiency: Secondary | ICD-10-CM | POA: Insufficient documentation

## 2016-05-19 DIAGNOSIS — I951 Orthostatic hypotension: Principal | ICD-10-CM | POA: Insufficient documentation

## 2016-05-19 DIAGNOSIS — R946 Abnormal results of thyroid function studies: Secondary | ICD-10-CM | POA: Insufficient documentation

## 2016-05-19 DIAGNOSIS — E0590 Thyrotoxicosis, unspecified without thyrotoxic crisis or storm: Secondary | ICD-10-CM | POA: Insufficient documentation

## 2016-05-19 DIAGNOSIS — F209 Schizophrenia, unspecified: Secondary | ICD-10-CM | POA: Insufficient documentation

## 2016-05-19 DIAGNOSIS — Z880 Allergy status to penicillin: Secondary | ICD-10-CM | POA: Insufficient documentation

## 2016-05-19 DIAGNOSIS — S0990XA Unspecified injury of head, initial encounter: Secondary | ICD-10-CM | POA: Insufficient documentation

## 2016-05-19 DIAGNOSIS — E221 Hyperprolactinemia: Secondary | ICD-10-CM | POA: Insufficient documentation

## 2016-05-19 DIAGNOSIS — W1830XA Fall on same level, unspecified, initial encounter: Secondary | ICD-10-CM | POA: Insufficient documentation

## 2016-05-19 DIAGNOSIS — Z886 Allergy status to analgesic agent status: Secondary | ICD-10-CM | POA: Insufficient documentation

## 2016-05-19 DIAGNOSIS — R45851 Suicidal ideations: Secondary | ICD-10-CM | POA: Insufficient documentation

## 2016-05-19 DIAGNOSIS — F3160 Bipolar disorder, current episode mixed, unspecified: Secondary | ICD-10-CM | POA: Insufficient documentation

## 2016-05-19 DIAGNOSIS — Z9889 Other specified postprocedural states: Secondary | ICD-10-CM | POA: Insufficient documentation

## 2016-05-19 DIAGNOSIS — Z79899 Other long term (current) drug therapy: Secondary | ICD-10-CM | POA: Insufficient documentation

## 2016-05-19 LAB — CORTISOL: CORTISOL PLASMA: 5.7 ug/dL

## 2016-05-19 LAB — LIPID PANEL
CHOL/HDL RATIO: 4.2 ratio
CHOLESTEROL: 122 mg/dL (ref 0–200)
HDL: 29 mg/dL — AB (ref >40)
LDL Cholesterol: 78 mg/dL (ref 0–99)
Triglycerides: 75 mg/dL (ref <150)
VLDL: 15 mg/dL (ref 0–40)

## 2016-05-19 LAB — HEMOGLOBIN A1C: HEMOGLOBIN A1C: 4.7 % (ref 4.0–6.0)

## 2016-05-19 LAB — TSH: TSH: 0.013 u[IU]/mL — AB (ref 0.350–4.500)

## 2016-05-19 LAB — T4, FREE: FREE T4: 1.65 ng/dL — AB (ref 0.61–1.12)

## 2016-05-19 MED ORDER — ACETAMINOPHEN 325 MG PO TABS
650.0000 mg | ORAL_TABLET | Freq: Four times a day (QID) | ORAL | Status: DC | PRN
Start: 2016-05-19 — End: 2016-05-22

## 2016-05-19 MED ORDER — ALUM & MAG HYDROXIDE-SIMETH 200-200-20 MG/5ML PO SUSP
30.0000 mL | ORAL | 0 refills | Status: DC | PRN
Start: 2016-05-19 — End: 2016-05-23

## 2016-05-19 MED ORDER — OXCARBAZEPINE 300 MG PO TABS: 150.0000 mg | ORAL_TABLET | Freq: Two times a day (BID) | ORAL | Status: DC

## 2016-05-19 MED ORDER — DULOXETINE HCL 30 MG PO CPEP: 30.0000 mg | ORAL_CAPSULE | Freq: Every day | ORAL | 0 refills | Status: DC

## 2016-05-19 MED ORDER — OXCARBAZEPINE 300 MG PO TABS
300.0000 mg | ORAL_TABLET | Freq: Two times a day (BID) | ORAL | Status: DC
  Administered 2016-05-19 – 2016-05-22 (×6): 300 mg via ORAL
  Filled 2016-05-19 (×7): qty 1

## 2016-05-19 MED ORDER — SODIUM CHLORIDE 0.9 % IV SOLN
INTRAVENOUS | Status: DC
  Administered 2016-05-19 – 2016-05-20 (×3): via INTRAVENOUS

## 2016-05-19 MED ORDER — DULOXETINE HCL 30 MG PO CPEP
30.0000 mg | ORAL_CAPSULE | Freq: Every day | ORAL | Status: DC
  Administered 2016-05-20 – 2016-05-22 (×3): 30 mg via ORAL
  Filled 2016-05-19 (×3): qty 1

## 2016-05-19 MED ORDER — OXYCODONE HCL 5 MG PO TABS: 5.0000 mg | ORAL_TABLET | ORAL | Status: DC | PRN

## 2016-05-19 MED ORDER — HALOPERIDOL 2 MG PO TABS
2.0000 mg | ORAL_TABLET | Freq: Every day | ORAL | Status: DC
  Administered 2016-05-19 – 2016-05-21 (×3): 2 mg via ORAL
  Filled 2016-05-19 (×3): qty 1

## 2016-05-19 MED ORDER — SODIUM CHLORIDE 0.9% FLUSH
3.0000 mL | Freq: Two times a day (BID) | INTRAVENOUS | Status: DC
  Administered 2016-05-20 – 2016-05-22 (×4): 3 mL via INTRAVENOUS

## 2016-05-19 MED ORDER — ONDANSETRON HCL 4 MG/2ML IJ SOLN: 4.0000 mg | Freq: Four times a day (QID) | INTRAMUSCULAR | Status: DC | PRN

## 2016-05-19 MED ORDER — COSYNTROPIN 0.25 MG IJ SOLR
0.2500 mg | Freq: Once | INTRAMUSCULAR | Status: AC
  Administered 2016-05-20: 0.25 mg via INTRAVENOUS
  Filled 2016-05-19: qty 0.25

## 2016-05-19 MED ORDER — ONDANSETRON HCL 4 MG PO TABS: 4.0000 mg | ORAL_TABLET | Freq: Four times a day (QID) | ORAL | Status: DC | PRN

## 2016-05-19 MED ORDER — HALOPERIDOL 2 MG PO TABS: 2.0000 mg | ORAL_TABLET | Freq: Every day | ORAL | 0 refills | Status: DC

## 2016-05-19 MED ORDER — OXCARBAZEPINE 300 MG PO TABS: 150.0000 mg | ORAL_TABLET | Freq: Two times a day (BID) | ORAL | 0 refills | Status: DC

## 2016-05-19 MED ORDER — CLONAZEPAM 0.5 MG PO TABS
0.5000 mg | ORAL_TABLET | Freq: Three times a day (TID) | ORAL | Status: DC
  Administered 2016-05-19 – 2016-05-22 (×8): 0.5 mg via ORAL
  Filled 2016-05-19 (×8): qty 1

## 2016-05-19 MED ORDER — MAGNESIUM HYDROXIDE 400 MG/5ML PO SUSP: 30.0000 mL | Freq: Every day | ORAL | 0 refills | Status: DC | PRN

## 2016-05-19 MED ORDER — ACETAMINOPHEN 650 MG RE SUPP: 650.0000 mg | Freq: Four times a day (QID) | RECTAL | Status: DC | PRN

## 2016-05-19 MED ORDER — ENOXAPARIN SODIUM 40 MG/0.4ML ~~LOC~~ SOLN
40.0000 mg | SUBCUTANEOUS | Status: DC
  Filled 2016-05-19 (×2): qty 0.4

## 2016-05-19 NOTE — Progress Notes (Signed)
Patient's VS checked at 1115(see MAR). MD notified of results and patient placed on 1:1 for safety. Patient also encouraged to drink fluids. Will continue to check/monitor patient to ensure safety.

## 2016-05-19 NOTE — Progress Notes (Signed)
Spoke with Dr.Hower about patient's request to shower. New order given for patient to shower. Roberto Watts,Roberto Watts

## 2016-05-19 NOTE — H&P (Addendum)
[facility] -  at [facility]   PATIENT NAME: Roberto Watts    MR#:  [id number]  DATE OF BIRTH:  [birth year]   DATE OF ADMISSION:  (Not on file)  PRIMARY CARE PHYSICIAN: Shelda Palim McGrath, MD   REQUESTING/REFERRING PHYSICIAN: kapur  CHIEF COMPLAINT:  lightheaded  HISTORY OF PRESENT ILLNESS:  Roberto Watts  is a 25 y.o. male with a known history of Bipolar disorder type I, schizophrenia who was originally admitted 05/17/2016 to the behavioral health unit for psychotic depression. He is brought to the emergency department on 05/15/2016 for suicidal ideation, started on Zyprexa 8/31. Yesterday while walking to lunch he stood up and felt lightheaded and had fall with head trauma. Blood pressures taken noted to be hypotensive 60s over 40s stat consult for medicine services. Patient evaluated by myself at that time received 1 L IV fluids with appropriate response in blood pressure and heart rate however, symptoms recurred today with noted orthostasis  133/71 - 77- lying 98/61 - 143 - standing   PAST MEDICAL HISTORY:   Past Medical History:  Diagnosis Date  . Medical history non-contributory     PAST SURGICAL HISTORY:   Past Surgical History:  Procedure Laterality Date  . TYMPANOSTOMY TUBE PLACEMENT      SOCIAL HISTORY:   Social History  Substance Use Topics  . Smoking status: Never Smoker  . Smokeless tobacco: Never Used  . Alcohol use No    FAMILY HISTORY:   Family History  Problem Relation Age of Onset  . Diabetes Neg Hx     DRUG ALLERGIES:   Allergies  Allergen Reactions  . Advil [Ibuprofen] Swelling  . Penicillins Swelling    REVIEW OF SYSTEMS:  REVIEW OF SYSTEMS:  CONSTITUTIONAL: Denies fevers, chills, fatigue, Positive weakness.  EYES: Denies blurred vision, double vision, or eye pain.  EARS, NOSE, THROAT: Denies tinnitus, ear pain, hearing loss.  RESPIRATORY: denies cough, shortness of breath, wheezing  CARDIOVASCULAR: Denies  chest pain, palpitations, edema.  GASTROINTESTINAL: Denies nausea, vomiting, diarrhea, abdominal pain.  GENITOURINARY: Denies dysuria, hematuria.  ENDOCRINE: Denies nocturia or thyroid problems. HEMATOLOGIC AND LYMPHATIC: Denies easy bruising or bleeding.  SKIN: Denies rash or lesions.  MUSCULOSKELETAL: Denies pain in neck, back, shoulder, knees, hips, or further arthritic symptoms.  NEUROLOGIC: Denies paralysis, paresthesias.  PSYCHIATRIC: Denies anxiety or depressive symptoms. Otherwise full review of systems performed by me is negative.   MEDICATIONS AT HOME:   Prior to Admission medications   Medication Sig Start Date End Date Taking? Authorizing Provider  benztropine (COGENTIN) 0.5 MG tablet Take 0.5 mg by mouth 2 (two) times daily.    Historical Provider, MD  escitalopram (LEXAPRO) 10 MG tablet Take 10 mg by mouth daily.    Historical Provider, MD  Oxcarbazepine (TRILEPTAL) 300 MG tablet Take 300 mg by mouth 2 (two) times daily.    Historical Provider, MD      VITAL SIGNS:  There were no vitals taken for this visit.  PHYSICAL EXAMINATION:  VITAL SIGNS:There were no vitals filed for this visit. GENERAL:25 y.o.male currently in no acute distress.  HEAD: Normocephalic, atraumatic.  EYES: Pupils equal, round, reactive to light. Extraocular muscles intact. No scleral icterus.  MOUTH: Moist mucosal membrane. Dentition intact. No abscess noted.  EAR, NOSE, THROAT: Clear without exudates. No external lesions.  NECK: Supple. No thyromegaly. No nodules. No JVD.  PULMONARY: Clear to ascultation, without wheeze rails or rhonci. No use of accessory muscles, Good respiratory effort. good air entry bilaterally CHEST:  Nontender to palpation.  CARDIOVASCULAR: S1 and S2. Tachycardic. No murmurs, rubs, or gallops. No edema. Pedal pulses 2+ bilaterally.  GASTROINTESTINAL: Soft, nontender, nondistended. No masses. Positive bowel sounds. No hepatosplenomegaly.  MUSCULOSKELETAL: No swelling,  clubbing, or edema. Range of motion full in all extremities.  NEUROLOGIC: Cranial nerves II through XII are intact. No gross focal neurological deficits. Sensation intact. Reflexes intact.  SKIN: No ulceration, lesions, rashes, or cyanosis. Skin warm and dry. Turgor intact.  PSYCHIATRIC: Mood, affect within normal limits. The patient is awake, alert and oriented x 3. Insight, judgment intact.    LABORATORY PANEL:   CBC  Recent Labs Lab 05/18/16 1407  WBC 4.7  HGB 14.4  HCT 39.9*  PLT 152   ------------------------------------------------------------------------------------------------------------------  Chemistries   Recent Labs Lab 05/18/16 1407  NA 136  K 4.8  CL 106  CO2 27  GLUCOSE 141*  BUN 18  CREATININE 0.76  CALCIUM 8.8*  AST 27  ALT 40  ALKPHOS 88  BILITOT 1.6*   ------------------------------------------------------------------------------------------------------------------  Cardiac Enzymes No results for input(s): TROPONINI in the last 168 hours. ------------------------------------------------------------------------------------------------------------------  RADIOLOGY:  Ct Head Wo Contrast  Result Date: 05/18/2016 CLINICAL DATA:  25 year old male status post fall while walking, head injury against wall. Hypotension at the time. Initial encounter. EXAM: CT HEAD WITHOUT CONTRAST TECHNIQUE: Contiguous axial images were obtained from the base of the skull through the vertex without intravenous contrast. COMPARISON:  None. FINDINGS: Brain: Cerebral volume is normal. No midline shift, ventriculomegaly, mass effect, evidence of mass lesion, intracranial hemorrhage or evidence of cortically based acute infarction. Gray-white matter differentiation is within normal limits throughout the brain. Vascular: No suspicious intracranial vascular hyperdensity. Skull: Calvarium intact.  No osseous abnormality identified. Sinuses/Orbits: Clear. Other: No scalp hematoma  identified. Visualized orbit soft tissues are within normal limits. IMPRESSION: Normal non contrast appearance of the brain. No acute traumatic injury identified. Electronically Signed   By: Odessa Fleming M.D.   On: 05/18/2016 13:08    EKG:   Orders placed or performed during the hospital encounter of 05/16/16  . EKG 12-Lead  . EKG 12-Lead    IMPRESSION AND PLAN:    25 year old Caucasian gentleman admitted to the behavioral health unit for suicidal ideation who suffered a fall and hypotension and is having repeated orthostasis today  1. Orthostatic hypotension: IV fluid hydration, given marginal low am cortisol - plan for acth stim test tomorrow 2. Abnormal thyroid function test: Mild abnormality 3. Suicidal ideation: Care per psychiatry   All the records are reviewed and case discussed with ED provider. Management plans discussed with the patient, family and they are in agreement.  CODE STATUS: full  TOTAL TIME TAKING CARE OF THIS PATIENT: 33 minutes.    Brisha Mccabe,  Mardi Mainland.D on 05/19/2016 at 5:20 PM  Between 7am to 6pm - Pager - [phone]  After 6pm: House Pager: - [phone]  [facility] [place] [facility]  Office  [phone]  CC: Primary care physician; Shelda Pal, MD

## 2016-05-19 NOTE — Progress Notes (Signed)
Patient's discharge instructions and teachings discussed with patient. He verbalized understanding. Patient was escorted by staff via wheelchair to the medical floor unit 2C bed 217.

## 2016-05-19 NOTE — Progress Notes (Signed)
[facility] [place] [facility]   PATIENT NAME: Roberto ReedyBrandyn Watts    MR#:  [id number]  DATE OF BIRTH:  May 13, 1991  SUBJECTIVE:  CHIEF COMPLAINT:  No chief complaint on file.  Felt lightheaded today. Tachycardia with standing up on orthostats  REVIEW OF SYSTEMS:    Review of Systems  Constitutional: Positive for malaise/fatigue. Negative for chills and fever.  HENT: Negative for sore throat.   Eyes: Negative for blurred vision, double vision and pain.  Respiratory: Negative for cough, hemoptysis, shortness of breath and wheezing.   Cardiovascular: Negative for chest pain, palpitations, orthopnea and leg swelling.  Gastrointestinal: Negative for abdominal pain, constipation, diarrhea, heartburn, nausea and vomiting.  Genitourinary: Negative for dysuria and hematuria.  Musculoskeletal: Negative for back pain and joint pain.  Skin: Negative for rash.  Neurological: Positive for dizziness. Negative for sensory change, speech change, focal weakness and headaches.  Endo/Heme/Allergies: Does not bruise/bleed easily.  Psychiatric/Behavioral: Positive for depression. The patient is not nervous/anxious.     DRUG ALLERGIES:   Allergies  Allergen Reactions  . Advil [Ibuprofen] Swelling  . Penicillins Swelling    VITALS:  Blood pressure (!) 114/58, pulse 69, temperature 98.8 F (37.1 C), temperature source Oral, resp. rate 18, height 5' 11.65" (1.82 m), weight 73.9 kg (163 lb), SpO2 99 %.  PHYSICAL EXAMINATION:   Physical Exam  GENERAL:  25 y.o.-year-old patient lying in the bed with no acute distress.  EYES: Pupils equal, round, reactive to light and accommodation. No scleral icterus. Extraocular muscles intact.  HEENT: Head atraumatic, normocephalic. Oropharynx and nasopharynx clear.  NECK:  Supple, no jugular venous distention. No thyroid enlargement, no tenderness.  LUNGS: Normal breath sounds bilaterally, no wheezing, rales, rhonchi. No use of  accessory muscles of respiration.  CARDIOVASCULAR: S1, S2 normal. No murmurs, rubs, or gallops.  ABDOMEN: Soft, nontender, nondistended. Bowel sounds present. No organomegaly or mass.  EXTREMITIES: No cyanosis, clubbing or edema b/l.    NEUROLOGIC: Cranial nerves II through XII are intact. No focal Motor or sensory deficits b/l.   PSYCHIATRIC: The patient is alert and awake SKIN: No obvious rash, lesion, or ulcer.   LABORATORY PANEL:   CBC  Recent Labs Lab 05/18/16 1407  WBC 4.7  HGB 14.4  HCT 39.9*  PLT 152   ------------------------------------------------------------------------------------------------------------------ Chemistries   Recent Labs Lab 05/18/16 1407  NA 136  K 4.8  CL 106  CO2 27  GLUCOSE 141*  BUN 18  CREATININE 0.76  CALCIUM 8.8*  AST 27  ALT 40  ALKPHOS 88  BILITOT 1.6*   ------------------------------------------------------------------------------------------------------------------  Cardiac Enzymes No results for input(s): TROPONINI in the last 168 hours. ------------------------------------------------------------------------------------------------------------------  RADIOLOGY:  Ct Head Wo Contrast  Result Date: 05/18/2016 CLINICAL DATA:  25 year old male status post fall while walking, head injury against wall. Hypotension at the time. Initial encounter. EXAM: CT HEAD WITHOUT CONTRAST TECHNIQUE: Contiguous axial images were obtained from the base of the skull through the vertex without intravenous contrast. COMPARISON:  None. FINDINGS: Brain: Cerebral volume is normal. No midline shift, ventriculomegaly, mass effect, evidence of mass lesion, intracranial hemorrhage or evidence of cortically based acute infarction. Gray-white matter differentiation is within normal limits throughout the brain. Vascular: No suspicious intracranial vascular hyperdensity. Skull: Calvarium intact.  No osseous abnormality identified. Sinuses/Orbits: Clear. Other: No  scalp hematoma identified. Visualized orbit soft tissues are within normal limits. IMPRESSION: Normal non contrast appearance of the brain. No acute traumatic injury identified. Electronically Signed   By: HRexene Edison  Margo Aye M.D.   On: 05/18/2016 13:08   ASSESSMENT AND PLAN:   25 year old Caucasian gentleman admitted to the behavioral health unit for suicidal ideation who suffered a fall and hypotension  * Hypotension Low normal today. Check cortisol level Monitor. BP much better compared to yesterday Increase PO fluids.  * Low TSH Check T4, T3 Euthyroid?  * Fall/head trauma: CT head checked no acute abnormalities.  * Suicidal ideation: Care per psychiatry  TOTAL TIME TAKING CARE OF THIS PATIENT: 25 minutes.   Discussed with Dr. Hedda Slade R M.D on 05/19/2016 at 11:50 AM  Between 7am to 6pm - Pager - [phone]  After 6pm go to [email or web address] - password [email or web address]  [facility]  Office  [phone]  CC: Primary care physician; Shelda Pal, MD  Note: This dictation was prepared with Dragon dictation along with smaller phrase technology. Any transcriptional errors that result from this process are unintentional.

## 2016-05-19 NOTE — Plan of Care (Signed)
Problem: Safety: Goal: Ability to remain free from injury will improve Outcome: Progressing No Falls.

## 2016-05-19 NOTE — [facility] Suicide Risk Assessment (Signed)
[facility]   Nursing information obtained from:  Patient, Review of record Demographic factors:  Male, Adolescent or [place] adult, Caucasian, Unemployed Current Mental Status:  Suicide plan, Suicidal ideation indicated by patient Loss Factors:  Decrease in vocational status Historical Factors:  NA Risk Reduction Factors:  Living with another person, especially a relative, Positive social support  Total Time spent with patient: 30 minutes Principal Problem: Major depressive disorder, recurrent, severe with psychotic features (HCC) Diagnosis:   Patient Active Problem List   Diagnosis Date Noted  . Suicidal ideation [R45.851] 05/17/2016    Priority: High  . Schizophrenia spectrum disorder with psychotic disorder type not yet determined (HCC) [F20.89, F29] 05/17/2016    Priority: High  . Orthostasis [I95.1] 05/19/2016  . Bipolar I disorder, most recent episode mixed (HCC) [F31.60] 05/17/2016  . 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 CT of the head was negative. He does have swelling above his left eye but denies any headaches, dizziness or change in vision. The patient denies any current active or passive suicidal thoughts. He is much more communicative than yesterday. He is more alert and is fully oriented. The patient did have a sitter last night but is ambulating well this morning and sitter was discontinued. He denies any auditory or visual hallucinations but says that for several weeks prior to admission he was hearing voices telling him to kill himself. At one point, he did appear to laugh as if he was responding to internal stimuli despite denying auditory or visual hallucinations. He also admits to having a lot of anger towards his family prior to admission and says he feels calmer and more relaxed now. Blood pressure was very low yesterday at the time of the fall but has been  slowly improving and stabilizing.   The patient had problems again today with low blood pressure and elevated pulse. He was seen by medicine and IV fluids were started again. He will be transferred to the medicine service for further medical workup.   Continued Clinical Symptoms:  Alcohol Use Disorder Identification Test Final Score (AUDIT): 0 The "Alcohol Use Disorders Identification Test", Guidelines for Use in Primary Care, Second Edition.  World Science writerHealth Organization [facility](WHO). Score between 0-7:  no or low risk or alcohol related problems. Score between 8-15:  moderate risk of alcohol related problems. Score between 16-19:  high risk of alcohol related problems. Score 20 or above:  warrants further diagnostic evaluation for alcohol dependence and treatment.   CLINICAL FACTORS:   Currently Psychotic   Musculoskeletal: Strength & Muscle Tone: within normal limits Gait & Station: normal Patient leans: N/A  Psychiatric Specialty Exam: Physical Exam  ROS  Blood pressure 133/71, pulse 77, temperature 97.5 F (36.4 C), temperature source Oral, resp. rate 18, height 5' 11.65" (1.82 m), weight 73.9 kg (163 lb), SpO2 97 %.Body mass index is 22.32 kg/m.  General Appearance: Disheveled  Eye Contact:  Fair  Speech:  Clear and Coherent and Normal Rate  Volume:  Normal  Mood:  Depressed  Affect:  Depressed  Thought Process:  Coherent  Orientation:  Full (Time, Place, and Person)  Thought Content:  He may be responding to internal stimuli  Suicidal Thoughts:  No  Homicidal Thoughts:  No  Memory:  Immediate;   Fair Recent;   Fair Remote;   Fair  Judgement:  Impaired  Insight:  Lacking  Psychomotor Activity:  Decreased  Concentration:  Concentration: Poor and Attention Span: Poor  Recall:  [employer] of Knowledge:  Fair  Language:  Good  Akathisia:  No  Handed:  Right  AIMS (if indicated):     Assets:  Communication Skills Desire for Improvement Housing Physical  Health Transportation  ADL's:  Intact  Cognition:  WNL  Sleep:  Number of Hours: 8.45      COGNITIVE FEATURES THAT CONTRIBUTE TO RISK:  Psychosis  SUICIDE RISK:   Mild:  Suicidal ideation of limited frequency, intensity, duration, and specificity.  There are no identifiable plans, no associated intent, mild dysphoria and related symptoms, good self-control (both objective and subjective assessment), few other risk factors, and identifiable protective factors, including available and accessible social support. Due to psychosis his risk of harm to self is more elevated   PLAN OF CARE:  Mr. Maloof is a 25 year old male with a history of psychotic depression admitted for worsening of his symptoms with depression, suicidal ideation and psychosis in the context of treatment noncompliance.  1. Suicidal ideation. The patient is able to contract for safety on the unit.He was placed with a one-to-one sitter yesterday secondary to fall risk. Sitter was discontinued today.  2. 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 It is still unclear what is causing blood pressure problems. It was initially thought that Zyprexa had caused low blood pressure but he has had problems again today with his pulse and blood pressure and did not have any Zyprexa yesterday. We'll continue low-dose Haldol 2 mg by mouth nightly for psychosis. He is also on Invega injections for psychosis and mood stabilization. Baseline EKG did not show any QTc prolongation. Total cholesterol, lipid panel and prolactin are pending. He is now agreeable to blood work. He refused blood work yesterday. The patient is being transferred to the medicine service and will continue with a one-to-one sitter.  3) History of Xanax and Alcohol Abuse: Toxicology screen was negative the time of admission. He was  advised to abstain from alcohol and all illicit drugs as they may worsen mood symptoms. The patient should be maintained in an outpatient substance abuse treatment program or at least NA/AA  4 Low BP/Elevated Pulse: The patient has been having problems with elevated pulse is afternoon. No falls today and he has steady on his feet. He was seen by medicine and I please fluids were started again. He will be transferred to the medicine service for further evaluation. He will need a one-to-one sitter  5. Treatment noncompliance. The patient refused medications  several days ago and was placed on an emergency forced medication protocol. The patient may benefit from a long-acting injection of the time of discharge if he continues to be assistant to taking oral medications.  6. Family contact. Dr. Jennet Maduro spoke with the patient's parents on the phone and this writer also spoke with the patient's family to alert them of his fall and the fact that he had improved today..   7. Metabolic syndrome monitoring. Lipid profil, hemoglobin A1c and prolactin level are pending.  8. Disposition. The patient is being transferred to the medicine service secondary to problems with his blood pressure and tachycardia. He will need a one-to-one sitter and will remain under involuntary commitment.   I certify that inpatient services furnished can reasonably be expected to improve the patient's condition.  Levora Angel, MD 05/19/2016,  5:59 PM

## 2016-05-19 NOTE — Progress Notes (Signed)
Patient resting quietly in bed with eyes open.

## 2016-05-19 NOTE — Progress Notes (Addendum)
[facility] MD Progress Note  05/19/2016 8:20 AM KAROL LIENDO  MRN:  [id number]   Principal Problem: Suicidal thoughts and psychosis  History of Present Illness The patient had a fall yesterday and hit his head. CT of the head was negative. He does have swelling above his left eye but denies any headaches, dizziness or change in vision. The patient denies any current active or passive suicidal thoughts. He is much more communicative than yesterday. He is more alert and is fully oriented. The patient did have a sitter last night but is ambulating well this morning and sitter was discontinued. He denies any auditory or visual hallucinations but says that for several weeks prior to admission he was hearing voices telling him to kill himself. At one point, he did appear to laugh as if he was responding to internal stimuli despite denying auditory or visual hallucinations. He also admits to having a lot of anger towards his family prior to admission and says he feels calmer and more relaxed now. Blood pressure was very low yesterday at the time of the fall but has been slowly improving and stabilizing.   The patient was encouraged to try and attend groups today and interact with peers. He was also encouraged to notify nursing if auditory hallucinations or visual hallucinations returned. Times spent discussing the need to be compliant with medications as well.  Past psychiatric history. He has been treated for depression and psychosis in the past. He responded well to a combination of Lexapro and Seroquel in 2013. For the past two months he has been on [place] sustenna injections, next on on 05/24/2016, Lexapro and trileptal prescribed by Dr. Jennelle Human at [facility]. There were also trials on Latuda and Depakote. He had his first visit with Dr. Daryll Drown at [place] [facility] yesterday. There is a remote history of marijuana abuse but the patient has been clean of substances lately.   Family psychiatric history.  Depression on mother's side. The mother is taking Cymbalta. Uncle with schizophrenia and sister with bipolar on his father's side.   Social history. He was raised by both biological parents and graduated from high school. He dropped out of college in the first semester secondary to drugs. He is currently staying with both of his parents in the [place] area. He was employed by [employer] until June.    Diagnosis:   Patient Active Problem List   Diagnosis Date Noted  . Suicidal ideation [R45.851] 05/17/2016    Priority: High  . Schizophrenia spectrum disorder with psychotic disorder type not yet determined ([place]) [F20.89, F29] 05/17/2016    Priority: High  . Major depressive disorder, recurrent, severe with psychotic features ([place]) [F33.3] 05/17/2016  . Bipolar I disorder, most recent episode mixed ([place]) [F31.60] 05/17/2016  . 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 Medical history non-contributory     Past Surgical History:  Procedure Laterality Date  . TYMPANOSTOMY TUBE PLACEMENT      Social History:  History  Alcohol Use No     History  Drug Use  . Types: Cocaine, Marijuana    Social History   Social History  . Marital status: Single    Spouse name: N/A  . Number of children: N/A  . Years of education: N/A   Social History Main Topics  . Smoking status: Never Smoker  . Smokeless tobacco:  Never Used  . Alcohol use No  . Drug use:     Types: Cocaine, Marijuana  . Sexual activity: No   Other Topics Concern  . None   Social History Narrative  . None   Additional Social History:    Pain Medications: denies Prescriptions: Pt not taking Over the Counter: denies History of alcohol / drug use?: Yes (per chart, hx of drug abuse)     Sleep: Good  Appetite:  Fair  Current Medications: Current Facility-Administered  Medications  Medication Dose Route Frequency Provider Last Rate Last Dose  . acetaminophen (TYLENOL) tablet 650 mg  650 mg Oral Q6H PRN Gonzella Lex, MD      . alum & mag hydroxide-simeth (MAALOX/MYLANTA) 200-200-20 MG/5ML suspension 30 mL  30 mL Oral Q4H PRN Gonzella Lex, MD      . DULoxetine (CYMBALTA) DR capsule 30 mg  30 mg Oral Daily Chauncey Mann, MD      . haloperidol (HALDOL) tablet 2 mg  2 mg Oral QHS Chauncey Mann, MD   2 mg at 05/18/16 2126  . magnesium hydroxide (MILK OF MAGNESIA) suspension 30 mL  30 mL Oral Daily PRN Gonzella Lex, MD      . Oxcarbazepine (TRILEPTAL) tablet 300 mg  300 mg Oral BID Clovis Fredrickson, MD   300 mg at 05/18/16 1729    Lab Results:  Results for orders placed or performed during the hospital encounter of 05/16/16 (from the past 48 hour(s))  CBC     Status: Abnormal   Collection Time: 05/18/16  2:07 PM  Result Value Ref Range   WBC 4.7 3.8 - 10.6 K/uL   RBC 4.25 (L) 4.40 - 5.90 MIL/uL   Hemoglobin 14.4 13.0 - 18.0 g/dL    Comment: RESULT REPEATED AND VERIFIED   HCT 39.9 (L) 40.0 - 52.0 %   MCV 93.8 80.0 - 100.0 fL   MCH 34.0 26.0 - 34.0 pg   MCHC 36.2 (H) 32.0 - 36.0 g/dL   RDW 11.9 11.5 - 14.5 %   Platelets 152 150 - 440 K/uL  Comprehensive metabolic panel     Status: Abnormal   Collection Time: 05/18/16  2:07 PM  Result Value Ref Range   Sodium 136 135 - 145 mmol/L   Potassium 4.8 3.5 - 5.1 mmol/L   Chloride 106 101 - 111 mmol/L   CO2 27 22 - 32 mmol/L   Glucose, Bld 141 (H) 65 - 99 mg/dL   BUN 18 6 - 20 mg/dL   Creatinine, Ser 0.76 0.61 - 1.24 mg/dL   Calcium 8.8 (L) 8.9 - 10.3 mg/dL   Total Protein 6.5 6.5 - 8.1 g/dL   Albumin 4.0 3.5 - 5.0 g/dL   AST 27 15 - 41 U/L   ALT 40 17 - 63 U/L   Alkaline Phosphatase 88 38 - 126 U/L   Total Bilirubin 1.6 (H) 0.3 - 1.2 mg/dL   GFR calc non Af Amer >60 >60 mL/min   GFR calc Af Amer >60 >60 mL/min    Comment: (NOTE) The eGFR has been calculated using the CKD EPI equation. This  calculation has not been validated in all clinical situations. eGFR's persistently <60 mL/min signify possible Chronic Kidney Disease.    Anion gap 3 (L) 5 - 15    Blood Alcohol level:  Lab Results  Component Value Date   ETH <5 36/08/2448    Metabolic Disorder Labs: No results found for: HGBA1C, MPG  No results found for: PROLACTIN No results found for: CHOL, TRIG, HDL, CHOLHDL, VLDL, LDLCALC  Physical Findings: AIMS: Facial and Oral Movements Muscles of Facial Expression: None, normal Lips and Perioral Area: None, normal Jaw: None, normal Tongue: None, normal,Extremity Movements Upper (arms, wrists, hands, fingers): None, normal Lower (legs, knees, ankles, toes): None, normal, Trunk Movements Neck, shoulders, hips: None, normal, Overall Severity Severity of abnormal movements (highest score from questions above): None, normal Incapacitation due to abnormal movements: None, normal Patient's awareness of abnormal movements (rate only patient's report): No Awareness, Dental Status Current problems with teeth and/or dentures?: No Does patient usually wear dentures?: No  CIWA:    COWS:     Musculoskeletal: Strength & Muscle Tone: within normal limits Gait & Station: normal Patient leans: N/A  Psychiatric Specialty Exam: Physical Exam  Constitutional: He is oriented to person, place, and time. He appears well-developed and well-nourished. No distress.  HENT:  Head: Normocephalic.  Mild swelling above left eye  Eyes: Conjunctivae and EOM are normal. Pupils are equal, round, and reactive to light. Right eye exhibits no discharge. Left eye exhibits no discharge. No scleral icterus.  Neck: Normal range of motion. Neck supple. No JVD present. No tracheal deviation present. No thyromegaly present.  Cardiovascular: Normal rate, regular rhythm and normal heart sounds.  Exam reveals no gallop and no friction rub.   No murmur heard. Respiratory: Effort normal and breath sounds  normal. No stridor. No respiratory distress. He has no wheezes. He has no rales. He exhibits no tenderness.  GI: Soft. Bowel sounds are normal. He exhibits mass. He exhibits no distension. There is no tenderness. There is no rebound and no guarding.  Musculoskeletal: Normal range of motion. He exhibits no edema, tenderness or deformity.  Lymphadenopathy:    He has no cervical adenopathy.  Neurological: He is alert and oriented to person, place, and time. He has normal reflexes. He displays normal reflexes. No cranial nerve deficit. He exhibits normal muscle tone. Coordination normal.  Skin: Skin is warm and dry. No rash noted. He is not diaphoretic. No erythema. No pallor.    Review of Systems  Constitutional: Negative for chills, diaphoresis, fever, malaise/fatigue and weight loss.  HENT: Negative.  Negative for hearing loss, sore throat and tinnitus.   Eyes: Negative.  Negative for blurred vision, double vision, photophobia, pain, discharge and redness.       Mild swelling above left eye. No bleeding or signs of infection.  Respiratory: Negative.  Negative for cough, hemoptysis, sputum production, shortness of breath and wheezing.   Cardiovascular: Negative.  Negative for chest pain, palpitations and orthopnea.  Gastrointestinal: Negative for abdominal pain, constipation, diarrhea, heartburn, nausea and vomiting.  Genitourinary: Negative.  Negative for dysuria, frequency and urgency.  Musculoskeletal: Negative.  Negative for back pain, myalgias and neck pain.  Skin: Negative.  Negative for itching and rash.  Neurological: Negative.  Negative for dizziness, tingling, tremors, sensory change, speech change, focal weakness, weakness and headaches.  Endo/Heme/Allergies: Negative.  Does not bruise/bleed easily.    Blood pressure (!) 109/50, pulse (!) 57, temperature 97.8 F (36.6 C), temperature source Oral, resp. rate 16, height 5' 11.65" (1.82 m), weight 73.9 kg (163 lb), SpO2 100 %.Body mass  index is 22.32 kg/m.  General Appearance: Disheveled  Eye Contact:  Poor  Speech:  Still slow but improved  Volume:  Decreased  Mood:  Better today  Affect:  Blunt  Thought Process:  Linear  Orientation:  Full (Time, Place, and Person)  Thought Content:  He is denying auditory hallucniations and VH but did laugh inappropriately at one point.  Suicidal Thoughts:  No  Homicidal Thoughts:  No  Memory:  Immediate;   Fair Recent;   Fair Remote;   Fair  Judgement:  Poor  Insight:  Lacking  Psychomotor Activity:  Decreased  Concentration:  Concentration: Fair and Attention Span: Fair  Recall:  Good  Fund of Knowledge:  Good  Language:  Good  Akathisia:  No  Handed:  Right  AIMS (if indicated):     Assets:  Housing Physical Health Social Support Vocational/Educational  ADL's:  Intact  Cognition:  Impaired,  Mild  Sleep:  Number of Hours: 8.45     Treatment Plan Summary:  Mr. Surgeon is a 25 year old male with a history of psychotic depression admitted for worsening of his symptoms with depression, suicidal ideation and psychosis in the context of treatment noncompliance.   1. Suicidal ideation. The patient is able to contract for safety on the unit.He was placed with a one-to-one sitter yesterday secondary to fall risk. Sitter was discontinued today.   2. 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 Zyprexa was therefore discontinued and he was started on Haldol 2 mg by mouth nightly for psychosis. He is also on Invega injections for psychosis and mood stabilization. Baseline EKG did not show any QTc prolongation. Total cholesterol, lipid panel and prolactin are pending. He is now agreeable to blood work. He refused blood work yesterday.   3) History of Xanax and Alcohol Abuse: Toxicology screen was negative the time of admission. He was advised to abstain from alcohol and all  illicit drugs as they may worsen mood symptoms. The patient should be maintained in an outpatient substance abuse treatment program or at least NA/AA   4. Treatment noncompliance. The patient refused medications  several days ago and was placed on an emergency forced medication protocol. The patient may benefit from a long-acting injection of the time of discharge if he continues to be assistant to taking oral medications.   5. Family contact. Dr. Bary Leriche spoke with the patient's parents on the phone and this writer also spoke with the patient's family to alert them of his fall and the fact that he had improved today..    6. Metabolic syndrome monitoring. Lipid profil, hemoglobin A1c and prolactin level are pending.   7. Disposition. He will be discharged to home with his parents. He will follow up with [facility] for psychotropic medication management .    Daily contact with patient to assess and evaluate symptoms and progress in treatment and Medication management  Jay Schlichter, MD 05/19/2016, 8:20 AM

## 2016-05-19 NOTE — [facility] Group Notes (Signed)
[facility] LCSW Group Therapy  05/19/2016 2:50 PM  Type of Therapy:  Group Therapy  Participation Level:  Patient did not attend group. CSW invited patient to group.   Summary of Progress/Problems:Coping Skills: Patients defined and discussed healthy coping skills. Patients identified healthy coping skills they would like to try during hospitalization and after discharge. CSW offered insight to varying coping skills that may have been new to patients such as practicing mindfulness.  Aniel Hubble G. Cylus [place] MSW, LCSWA 05/19/2016, 2:50 PM  

## 2016-05-19 NOTE — Progress Notes (Signed)
Patient eating lunch and talking to MD's. Patient encouraged to drink lots of fluids. Patient cooperative.

## 2016-05-19 NOTE — Progress Notes (Signed)
Patient walked the hallways with 1:1 staff. No falls.

## 2016-05-19 NOTE — Progress Notes (Signed)
Patient placed on 1:1 for Falls precaution. Patient resting quietly in bed with eyes open.

## 2016-05-19 NOTE — Progress Notes (Deleted)
The patient continued to have problems with elevated pulse and low blood pressure today. Case discussed with Dr. Elpidio AnisSudini. Will transfer to the medical floor. He was placed with a one-to-one sitter again this afternoon and vital signs are to be checked every 2 hours.

## 2016-05-19 NOTE — Progress Notes (Signed)
Received report from behavioral health nurse. Told he denies SI however nursing supervisor recommends sitter. Will have sitter coverage when he arrives on the floor. Otilio JeffersonMadelyn S Fenton, RN

## 2016-05-19 NOTE — Progress Notes (Signed)
Patient resting in bed quietly

## 2016-05-19 NOTE — Discharge Summary (Signed)
Physician Discharge Summary Note  Patient:  Roberto Watts is an 25 y.o., male MRN:  [id number] DOB:  11-20-1990 Patient phone:  [phone] (home)  Patient address:   [address] [place] [address],  Total Time spent with patient: 1 hour  Date of Admission:  05/16/2016 Date of Discharge: 05/19/16  Reason for Admission:    History of present illness. Information is obtained from the patient, the chart and his parents. The patient was diagnosed with psychotic depression and was hospitalized at [facility] in 2013. He responded well to a combination of Lexapro and 400 mg of Seroquel. He was seen at [facility] clinic for his first time evaluation. The patient appeared depressed and threatened to shoot himself. They took comittment papers out. He was brought to [facility] emergency room where he was again evaluated by Roberto Watts, who is familiar with this patient, and was again given diagnosis of psychotic depression. This morning the patient presented completely differently. He is clearly attending to internal stimuli laughing and giggling inappropriately. He is irritable, intrusive and loud. He demands immediate discharge and does not feel that he belongs in the hospital. She refused medications last night and this morning. He adamantly denies any symptoms of depression, anxiety, psychosis, or symptoms suggestive of bipolar mania. He denies alcohol or illicit substance use.   Past psychiatric history. He has been treated for depression and psychosis in the past. He responded well to a combination of Lexapro and Seroquel in 2013. For the past two months he has been on [place] sustenna injections, next on on 05/24/2016, Lexapro and trileptal prescribed by Dr. Halina Watts at [facility]. There were also trials on Latuda and Depakote. He had his first visit with Roberto Watts at [facility] yesterday. There is a remote history of marijuana abuse but the  patient has been clean of substances lately.  Family psychiatric history. Depression on mother's side. The mother is taking Cymbalta. Uncle with schizophrenia and sister with bipolar on his father's side.  Social history. He graduated from high school. He went to [employer] for a year and then to [employer] for one semester. He lives with his parents. He was employed until June by [employer].    Principal Problem: Major depressive disorder, recurrent, severe with psychotic features [facility]) Discharge Diagnoses: Patient Active Problem List   Diagnosis Date Noted  . Suicidal ideation [R45.851] 05/17/2016    Priority: High  . Schizophrenia spectrum disorder with psychotic disorder type not yet determined (HCC) [F20.89, F29] 05/17/2016    Priority: High  . Orthostasis [I95.1] 05/19/2016  . Bipolar I disorder, most recent episode mixed (HCC) [F31.60] 05/17/2016  . 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 Medical history non-contributory     Past Surgical History:  Procedure Laterality Date  . TYMPANOSTOMY TUBE PLACEMENT     Family History:  Family History  Problem Relation Age of Onset  . Diabetes Neg Hx     Social History:  History  Alcohol Use No     History  Drug Use  . Types: Cocaine, Marijuana    Social History   Social History  . Marital status: Single    Spouse name: N/A  . Number of children: N/A  . Years of education: N/A   Social History Main Topics  . Smoking status: Never Smoker  . Smokeless tobacco:  Never Used  . Alcohol use No  . Drug use:     Types: Cocaine, Marijuana  . Sexual activity: No   Other Topics Concern  . None   Social History Narrative  . None    Hospital Course:   Roberto Watts is a 25 year old male with a history of psychotic depression admitted for worsening of his symptoms with depression, suicidal ideation and psychosis  in the context of treatment noncompliance.  1. Suicidal ideation. The patient is able to contract for safety on the unit.He was placed with a one-to-one sitter yesterday secondary to fall risk.   2. 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 The patient was initially placed on a combination of Zyprexa, Trileptal and Lexapro. Zyprexa was discontinued on September 2 after the patient had a fall. The patient started to struggle with low blood pressure and it was initially thought that Zyprexa was contributing to problems with low blood pressure and elevated heart rate. However, on September 3, the patient had not had any Zyprexa the night before she needed to struggle with low blood pressure and elevated heart rate. After Zyprexa was discontinued, he was started on Haldol 2 mg by mouth nightly for psychosis and was given a lower dose of Trileptal 150 mg by mouth twice a day for mood stabilization. He remained also on Cogentin 0.5 mg by mouth twice a day for any EPS. The patient has Lexapro 10 mg by mouth daily at the time of admission but his parents insisted that he had not done well with Lexapro and he was then started on Cymbalta 30 mg by mouth daily with plan to titrate up to see milligrams by mouth daily after 5 days for depression. EKG did not show any QTc prolongation. Total cholesterol was 122 and hemoglobin A1c was 4.7. Prolactin level was pending. The patient was placed with a one-to-one sitter secondary to fall risk. He denied any suicidal thoughts throughout his hospitalization. He did initially endorsed some auditory and visual hallucinations but at the time of discharge was denying any auditory or visual hallucinations. He did appear to respond to internal stimuli at times however on the day of discharge.   3) History of Xanax and Alcohol Abuse:  Toxicology screen was negative the time of admission. He was advised to abstain from alcohol and all illicit drugs as they may worsen mood symptoms. The patient should be maintained in an outpatient substance abuse treatment program or at least NA/AA  4 Low BP/Elevated Pulse: The patient has been having problems with elevated pulse on September 2 and 3rd. He did have a fall on September 2nd with brief loss of consciousness. Head CT was negative for any bleeding. No neurological changes. The patient was seen by medicine and he was given 1 L of IV fluids. Blood pressure and pulse improved on the morning of September 3 but he then became orthostatic and tachycardic in the afternoon on September 3. He was seen again by medicine and the decision was made to transfer him to the medicine service for further medical workup. Cortisol level was elevated at 5.7 and T4 was elevated as well. Per the patient's family he had apparently had some difficulty with "a heat stroke" in the hospital and some possible endocrine problems one year ago.   5. Family contact. Dr. Jennet MaduroPucilowska spoke with the patient's parents on the phone and this  Clinical research associate also spoke with the patient's family to alert them of his fall . His parents were also alerted of the need to transfer to the medical floor.  6. Disposition. The patient is being transferred to the medicine service secondary to problems with his blood pressure and tachycardia. He will need a one-to-one sitter and will remain under involuntary commitment. He will remain on Cymbalta 30 mg by mouth daily him a Trileptal 150 mg by mouth twice a day and Haldol 2 mg by mouth nightly. Will avoid high doses of psychotropic medications for now due to tachycardia and blood pressure changes. At this time, it is unclear what is causing his tachycardia and needs further medical workup.Discharge plans were discussed with the patient's father as well as the patient's on an uncle who visited him on the  inpatient psychiatry unit. Family members were in agreement with transfer to the medical floor.   Physical Findings: AIMS: Facial and Oral Movements Muscles of Facial Expression: None, normal Lips and Perioral Area: None, normal Jaw: None, normal Tongue: None, normal,Extremity Movements Upper (arms, wrists, hands, fingers): None, normal Lower (legs, knees, ankles, toes): None, normal, Trunk Movements Neck, shoulders, hips: None, normal, Overall Severity Severity of abnormal movements (highest score from questions above): None, normal Incapacitation due to abnormal movements: None, normal Patient's awareness of abnormal movements (rate only patient's report): No Awareness, Dental Status Current problems with teeth and/or dentures?: No Does patient usually wear dentures?: No   Musculoskeletal: Strength & Muscle Tone: within normal limits Gait & Station: normal Patient leans: N/A  Psychiatric Specialty Exam: Physical Exam  Review of Systems  Constitutional: Positive for malaise/fatigue. Negative for chills, diaphoresis, fever and weight loss.  HENT: Negative for hearing loss, sore throat and tinnitus.        Some mild dizzines with standing  Eyes: Negative.  Negative for blurred vision, double vision and photophobia.  Respiratory: Negative.  Negative for cough, hemoptysis, sputum production, shortness of breath and wheezing.   Cardiovascular: Negative.  Negative for chest pain, palpitations, orthopnea, claudication and PND.       Tachycardia  Gastrointestinal: Negative.  Negative for abdominal pain, blood in stool, constipation, diarrhea, heartburn, melena, nausea and vomiting.  Genitourinary: Negative.  Negative for dysuria, frequency and urgency.  Musculoskeletal: Negative.  Negative for back pain, falls, joint pain, myalgias and neck pain.  Skin: Negative.  Negative for itching and rash.  Neurological: Positive for dizziness. Negative for tingling, tremors, sensory change, speech  change, focal weakness, seizures, loss of consciousness, weakness and headaches.  Endo/Heme/Allergies: Negative.  Does not bruise/bleed easily.    Blood pressure 133/71, pulse 77, temperature 97.5 F (36.4 C), temperature source Oral, resp. rate 18, height 5' 11.65" (1.82 m), weight 73.9 kg (163 lb), SpO2 97 %.Body mass index is 22.32 kg/m.  General Appearance: Disheveled  Eye Contact:  Good  Speech:  Clear and Coherent and Normal Rate  Volume:  Normal  Mood:  Depressed  Affect:  Depressed  Thought Process:  Sometimes disorganized but he is denying any hallucinations  Orientation:  Full (Time, Place, and Person)  Thought Content:  Tangential  Suicidal Thoughts:  No  Homicidal Thoughts:  No  Memory:  Immediate;   Fair Recent;   Fair Remote;   Fair  Judgement:  Impaired  Insight:  Lacking  Psychomotor Activity:  Normal  Concentration:  Concentration: Fair and Attention Span: Fair  Recall:  [employer] of Knowledge:  Fair  Language:  Good  Akathisia:  No  Handed:  Right  AIMS (if indicated):     Assets:  Communication Skills Housing Physical Health Social Support Transportation Vocational/Educational  ADL's:  Intact  Cognition:  WNL  Sleep:  Number of Hours: 8.45     Have you used any form of tobacco in the last 30 days? (Cigarettes, Smokeless Tobacco, Cigars, and/or Pipes): No  Has this patient used any form of tobacco in the last 30 days? (Cigarettes, Smokeless Tobacco, Cigars, and/or Pipes) Yes, N/A  Blood Alcohol level:  Lab Results  Component Value Date   ETH <5 05/15/2016    Metabolic Disorder Labs:  Lab Results  Component Value Date   HGBA1C 4.7 05/19/2016   No results found for: PROLACTIN Lab Results  Component Value Date   CHOL 122 05/19/2016   TRIG 75 05/19/2016   HDL 29 (L) 05/19/2016   CHOLHDL 4.2 05/19/2016   VLDL 15 05/19/2016   LDLCALC 78 05/19/2016    See Psychiatric Specialty Exam and Suicide Risk Assessment completed by Attending  Physician prior to discharge.  Discharge destination:  Other:  [facility]  Is patient on multiple antipsychotic therapies at discharge:  Yes,   Do you recommend tapering to monotherapy for antipsychotics?  No    Has Patient had three or more failed trials of antipsychotic monotherapy by history:  Yes: Seroquel, Latuda, Invega  Recommended Plan for Multiple Antipsychotic Therapies: Patient's medications are in the process of a cross-taper;  medications include:  Haldol and Invega    Medication List    STOP taking these medications   escitalopram 10 MG tablet Commonly known as:  LEXAPRO     TAKE these medications     Indication  alum & mag hydroxide-simeth 200-200-20 MG/5ML suspension Commonly known as:  MAALOX/MYLANTA Take 30 mLs by mouth every 4 (four) hours as needed for indigestion.  Indication:  Constipation   benztropine 0.5 MG tablet Commonly known as:  COGENTIN Take 0.5 mg by mouth 2 (two) times daily.  Indication:  Extrapyramidal Reaction caused by Medications   DULoxetine 30 MG capsule Commonly known as:  CYMBALTA Take 1 capsule (30 mg total) by mouth daily.  Indication:  Major Depressive Disorder   haloperidol 2 MG tablet Commonly known as:  HALDOL Take 1 tablet (2 mg total) by mouth at bedtime.  Indication:  Schizophrenia   magnesium hydroxide 400 MG/5ML suspension Commonly known as:  MILK OF MAGNESIA Take 30 mLs by mouth daily as needed for mild constipation.  Indication:  Constipation   Oxcarbazepine 300 MG tablet Commonly known as:  TRILEPTAL Take 0.5 tablets (150 mg total) by mouth 2 (two) times daily. What changed:  how much to take  Indication:  Manic-Depression        Follow-up recommendations:  Activity:  As tolerated Diet:  Regular   Signed: Levora Angel, MD 05/19/2016, 6:24 PM

## 2016-05-19 NOTE — Progress Notes (Signed)
Patient ID: Cindee LameBrandyn M Watts, male   DOB: 1990/10/08, 25 y.o.   MRN: [id number]  Patient's Aunt, Roberto Watts called to speak with CSW. Family member wanted an update on the patient. CSW provided updated to family member. Both Aunt and father expressed to CSW that they would like to have patient [facility]. CSW informed family that she would notify his physician.   CSW spoke with physician on call, tentatively patient will be transferred to medical floor due to elevated pulse and low blood pressure.  Roberto Watts MSW, [facility] 05/19/2016 4:42 PM

## 2016-05-19 NOTE — Progress Notes (Signed)
Patient resting in room. 1:1 staff present.

## 2016-05-19 NOTE — Progress Notes (Signed)
Report called to Ms. Matty on 2C.

## 2016-05-19 NOTE — [facility] Group Notes (Signed)
[facility] Group Notes:  (Nursing/MHT/Case Management/Adjunct)  Date:  05/19/2016  Time:  3:57 AM  Type of Therapy:  Psychoeducational Skills  Participation Level:  Did Not Attend   Summary of Progress/Problems:  Roberto MilroyLaquanda Y Shamariah Watts 05/19/2016, 3:57 AM

## 2016-05-19 NOTE — Plan of Care (Signed)
Problem: Coping: Goal: Ability to verbalize frustrations and anger appropriately will improve Outcome: Progressing Patient able to verbalize frustrations at this time CTownsend RN   

## 2016-05-19 NOTE — Progress Notes (Signed)
D: Patient is  Groggy but oriented on the unit this shift. Patient not  attended and actively participated in groups today. Patient denies suicidal ideation, homicidal ideation, auditory or visual hallucinations at the present time.  A: Scheduled medications are administered to patient as per MD orders. Emotional support and encouragement are provided. Patient is maintained on q.15 minute safety checks. Patient is informed to notify staff with questions or concerns. R: No adverse medication reactions are noted. Patient is cooperative with medication administration  . Patient is receptive, calm and cooperative on the unit at this time. Patient does not  Interact with others on the unit this shift. Patient contracts for safety at this time. Patient remains safe at this time.patient has a Designer, fashion/clothing1;1 sitter

## 2016-05-20 DIAGNOSIS — F333 Major depressive disorder, recurrent, severe with psychotic symptoms: Secondary | ICD-10-CM | POA: Diagnosis not present

## 2016-05-20 LAB — BASIC METABOLIC PANEL
Anion gap: 3 — ABNORMAL LOW (ref 5–15)
BUN: 13 mg/dL (ref 6–20)
CHLORIDE: 108 mmol/L (ref 101–111)
CO2: 29 mmol/L (ref 22–32)
CREATININE: 0.76 mg/dL (ref 0.61–1.24)
Calcium: 8.9 mg/dL (ref 8.9–10.3)
Glucose, Bld: 89 mg/dL (ref 65–99)
POTASSIUM: 4.4 mmol/L (ref 3.5–5.1)
SODIUM: 140 mmol/L (ref 135–145)

## 2016-05-20 LAB — CBC
HCT: 37.3 % — ABNORMAL LOW (ref 40.0–52.0)
Hemoglobin: 13.4 g/dL (ref 13.0–18.0)
MCH: 33.7 pg (ref 26.0–34.0)
MCHC: 35.9 g/dL (ref 32.0–36.0)
MCV: 93.8 fL (ref 80.0–100.0)
PLATELETS: 154 10*3/uL (ref 150–440)
RBC: 3.98 MIL/uL — AB (ref 4.40–5.90)
RDW: 11.8 % (ref 11.5–14.5)
WBC: 5.6 10*3/uL (ref 3.8–10.6)

## 2016-05-20 LAB — ACTH STIMULATION, 3 TIME POINTS
CORTISOL 30 MIN: 13.4 ug/dL
CORTISOL 60 MIN: 17 ug/dL
Cortisol, Base: 12.8 ug/dL

## 2016-05-20 LAB — PROLACTIN: Prolactin: 97.6 ng/mL — ABNORMAL HIGH (ref 4.0–15.2)

## 2016-05-20 LAB — T3, FREE: T3, Free: 6.1 pg/mL — ABNORMAL HIGH (ref 2.0–4.4)

## 2016-05-20 MED ORDER — DEXAMETHASONE SODIUM PHOSPHATE 4 MG/ML IJ SOLN
4.0000 mg | Freq: Two times a day (BID) | INTRAMUSCULAR | Status: DC
  Administered 2016-05-20 (×2): 4 mg via INTRAVENOUS
  Filled 2016-05-20 (×3): qty 1

## 2016-05-20 NOTE — Consult Note (Signed)
[facility] Face-to-Face Psychiatry Consult   Reason for Consult:  Consult for this 25 year old man with a history of recurrent depression sent here from [place] because of suicidal statements. Referring Physician:  McShane Patient Identification: Roberto Watts MRN:  [id number] Principal Diagnosis: <principal problem not specified> Diagnosis:   Patient Active Problem List   Diagnosis Date Noted  . Orthostasis [I95.1] 05/19/2016  . Suicidal ideation [R45.851] 05/17/2016  . Schizophrenia spectrum disorder with psychotic disorder type not yet determined ([place]) [F20.89, F29] 05/17/2016  . Bipolar I disorder, most recent episode mixed ([place]) [F31.60] 05/17/2016  . 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 male patient admitted with "they must think I'm depressed".   This is a follow-up note on September 92. 25 year old man with a history of possible bipolar disorder or recurrent psychotic depression. He had been admitted to the psychiatric ward last week but over the weekend suffered a fall and has been having orthostatic hypotension. He is now on the medical service but still under involuntary commitment. On interview today the patient tells me that he is feeling well. Denies any mood symptoms. Denies suicidal thoughts at all. Denies having any psychotic symptoms. He tells me he has been able to get up out of bed and walk to the restroom without getting dizzy.  HPI:  Patient interviewed. Chart reviewed. Patient known to me from previous encounters as well. This is a 25 year old man with a history of mental health problems who was sent here on IVC from [facility] counseling. He went there for evaluation and told them that he was having active suicidal thoughts. Apparently he then left there and went back home. They had become alarmed by his statements and filed commitment papers. Police picked him up from home and brought  him in. Patient tells me that he feels like he wishes he were dead all of the time. Despite saying this he says that he doesn't think he is depressed. He talks about how he's upset because he doesn't have a job. Evidently he quit his job a few months ago because he felt like they were treating him badly. His description of it sounds rather paranoid. He says since then he just been staying with his parents not doing much although he claims to be looking for work. A lot. Eats a lot. Not taking any current psychiatric medicine. Had been taking Lexapro Trileptal and and in [place] injection at some point up to some months ago but then discontinued them. Patient repeats to me that he wishes he were dead all the time but denies any plan to act on it. Denies any hallucinations or psychotic symptoms. He is not a very effective historian rather vague in his descriptions.  Social history: Patient lives with his biological parents. He is not currently working. Not going for any outpatient treatment. Says he spends most of his day doing not much of anything.  Medical history: Denies any significant known ongoing medical problems.  Substance abuse history: Patient has a past history of abuse of drugs but claims that he has not been using any alcohol or drugs anytime recently.  Past Psychiatric History: Patient has a past history of psychiatric admission and odd symptoms including symptoms of depression with paranoia and psychotic-like behavior. His insight has been poor about it. He had been treated in the past with a combination of antidepressants and antipsychotics however with pretty  good response. Not clear that he's ever had a true bipolar episode. Doesn't have much insight into his paranoid and psychotic thinking.  Risk to Self: Is patient at risk for suicide?: No Risk to Others:   Prior Inpatient Therapy:   Prior Outpatient Therapy:    Past Medical History:  Past Medical History:  Diagnosis Date  . Medical  history non-contributory     Past Surgical History:  Procedure Laterality Date  . TYMPANOSTOMY TUBE PLACEMENT     Family History:  Family History  Problem Relation Age of Onset  . Diabetes Neg Hx    Family Psychiatric  History: Denies knowing of any family history of any mental health problems Social History:  History  Alcohol Use No     History  Drug Use  . Types: Cocaine, Marijuana    Social History   Social History  . Marital status: Single    Spouse name: N/A  . Number of children: N/A  . Years of education: N/A   Social History Main Topics  . Smoking status: Never Smoker  . Smokeless tobacco: Never Used  . Alcohol use No  . Drug use:     Types: Cocaine, Marijuana  . Sexual activity: No   Other Topics Concern  . None   Social History Narrative  . None   Additional Social History:    Allergies:   Allergies  Allergen Reactions  . Advil [Ibuprofen] Swelling  . Penicillins Swelling    Labs:  Results for orders placed or performed during the hospital encounter of 05/19/16 (from the past 48 hour(s))  Basic metabolic panel     Status: Abnormal   Collection Time: 05/20/16  6:10 AM  Result Value Ref Range   Sodium 140 135 - 145 mmol/L   Potassium 4.4 3.5 - 5.1 mmol/L   Chloride 108 101 - 111 mmol/L   CO2 29 22 - 32 mmol/L   Glucose, Bld 89 65 - 99 mg/dL   BUN 13 6 - 20 mg/dL   Creatinine, Ser 0.76 0.61 - 1.24 mg/dL   Calcium 8.9 8.9 - 10.3 mg/dL   GFR calc non Af Amer >60 >60 mL/min   GFR calc Af Amer >60 >60 mL/min    Comment: (NOTE) The eGFR has been calculated using the CKD EPI equation. This calculation has not been validated in all clinical situations. eGFR's persistently <60 mL/min signify possible Chronic Kidney Disease.    Anion gap 3 (L) 5 - 15  CBC     Status: Abnormal   Collection Time: 05/20/16  6:10 AM  Result Value Ref Range   WBC 5.6 3.8 - 10.6 K/uL   RBC 3.98 (L) 4.40 - 5.90 MIL/uL   Hemoglobin 13.4 13.0 - 18.0 g/dL   HCT 37.3  (L) 40.0 - 52.0 %   MCV 93.8 80.0 - 100.0 fL   MCH 33.7 26.0 - 34.0 pg   MCHC 35.9 32.0 - 36.0 g/dL   RDW 11.8 11.5 - 14.5 %   Platelets 154 150 - 440 K/uL  ACTH stimulation, 3 time points     Status: None   Collection Time: 05/20/16  6:10 AM  Result Value Ref Range   Cortisol, Base 12.8 ug/dL    Comment: NO NORMAL RANGE ESTABLISHED FOR THIS TEST   Cortisol, 30 Min 13.4 ug/dL   Cortisol, 60 Min 17.0 ug/dL    Comment: Performed at [facility]    Current Facility-Administered Medications  Medication Dose Route Frequency Provider Last Rate  Last Dose  . 0.9 %  sodium chloride infusion   Intravenous Continuous Lytle Butte, MD 100 mL/hr at 05/20/16 0110    . acetaminophen (TYLENOL) tablet 650 mg  650 mg Oral Q6H PRN Lytle Butte, MD       Or  . acetaminophen (TYLENOL) suppository 650 mg  650 mg Rectal Q6H PRN Lytle Butte, MD      . clonazePAM Bobbye [place]) tablet 0.5 mg  0.5 mg Oral TID Chauncey Mann, MD   0.5 mg at 05/20/16 1705  . dexamethasone (DECADRON) injection 4 mg  4 mg Intravenous Q12H Srikar Sudini, MD   4 mg at 05/20/16 1204  . DULoxetine (CYMBALTA) DR capsule 30 mg  30 mg Oral Daily Lytle Butte, MD   30 mg at 05/20/16 1118  . enoxaparin (LOVENOX) injection 40 mg  40 mg Subcutaneous Q24H Lytle Butte, MD      . haloperidol (HALDOL) tablet 2 mg  2 mg Oral QHS Lytle Butte, MD   2 mg at 05/19/16 2342  . ondansetron (ZOFRAN) tablet 4 mg  4 mg Oral Q6H PRN Lytle Butte, MD       Or  . ondansetron [facility]) injection 4 mg  4 mg Intravenous Q6H PRN Lytle Butte, MD      . Oxcarbazepine (TRILEPTAL) tablet 300 mg  300 mg Oral BID Lytle Butte, MD   300 mg at 05/20/16 1118  . sodium chloride flush (NS) 0.9 % injection 3 mL  3 mL Intravenous Q12H Lytle Butte, MD   3 mL at 05/20/16 1000    Musculoskeletal: Strength & Muscle Tone: within normal limits Gait & Station: normal Patient leans: N/A  Psychiatric Specialty Exam: Physical Exam  Nursing note and vitals  reviewed. Constitutional: He appears well-developed and well-nourished.  HENT:  Head: Normocephalic and atraumatic.  Eyes: Conjunctivae are normal. Pupils are equal, round, and reactive to light.  Neck: Normal range of motion.  Cardiovascular: Regular rhythm and normal heart sounds.   Respiratory: Effort normal. No respiratory distress.  GI: Soft.  Musculoskeletal: Normal range of motion.  Neurological: He is alert.  Skin: Skin is warm and dry.  Psychiatric: His mood appears anxious. His affect is blunt and inappropriate. His speech is delayed. He is slowed. Thought content is paranoid. Cognition and memory are normal. He expresses impulsivity. He exhibits a depressed mood. He expresses suicidal ideation.    Review of Systems  Constitutional: Negative.   HENT: Negative.   Eyes: Negative.   Respiratory: Negative.   Cardiovascular: Negative.   Gastrointestinal: Negative.   Musculoskeletal: Negative.   Skin: Negative.   Neurological: Negative.   Psychiatric/Behavioral: Negative for depression and suicidal ideas.    Blood pressure 121/75, pulse (!) 129, temperature 97.7 F (36.5 C), temperature source Oral, resp. rate 20, height 5' 11" (1.803 m), weight 72.6 kg (160 lb), SpO2 97 %.Body mass index is 22.32 kg/m.  General Appearance: Casual  Eye Contact:  Good  Speech:  Clear and Coherent  Volume:  Decreased  Mood:  Euthymic  Affect:  Congruent  Thought Process:  Goal Directed  Orientation:  Full (Time, Place, and Person)  Thought Content:  Paranoid Ideation  Suicidal Thoughts:  No  Homicidal Thoughts:  No  Memory:  Immediate;   Good Recent;   Fair Remote;   Fair  Judgement:  Fair  Insight:  Fair  Psychomotor Activity:  Decreased  Concentration:  Concentration: Good  Recall:  Fair  Fund of Knowledge:  Fair  Language:  Fair  Akathisia:  No  Handed:  Right  AIMS (if indicated):     Assets:  Housing Resilience Social Support  ADL's:  Intact  Cognition:  WNL  Sleep:         Treatment Plan Summary: Daily contact with patient to assess and evaluate symptoms and progress in treatment, Medication management and Plan Follow-up this 25 year old man with chronic mental health issues. He presents this afternoon as being reasonably well-groomed, wide awake, cooperative and euthymic and denying any suicidal ideation and not showing overt psychosis. For now however I'm going to keep him under commitment. Patient has in my experience a history of being able to conceal a lot of psychiatric symptoms intermittently. Continue without any acute changes to medicine. I will follow up tomorrow. We will see once he is medically clear whether or not he still needs psychiatric inpatient treatment.  Disposition: Recommend psychiatric Inpatient admission when medically cleared. Supportive therapy provided about ongoing stressors.  Alethia Berthold, MD 05/20/2016 6:27 PM

## 2016-05-20 NOTE — Plan of Care (Signed)
Problem: Physical Regulation: Goal: Ability to maintain clinical measurements within normal limits will improve Outcome: Progressing Orthostatic hypotension improving

## 2016-05-20 NOTE — Care Management Note (Signed)
Case Management Note  Patient Details  Name: Roberto Watts MRN: [id number] Date of Birth: 1991/01/01  Subjective/Objective:  25yo Mr Roberto Watts was admitted 05/19/16 per major depressive DO. PCP=Dr Sherre Pootim McGraph and outpatient [facility]. Lives at home with his parents. Placed on [facility] med-surg unit per adrenal insufficiency and orthostatic hypertension. Currently IVC and has Watts 1:1 sitter for safety. Consulting visits by [facility]-psychiatry. [facility].                   Action/Plan:   Expected Discharge Date:                  Expected Discharge Plan:     In-House Referral:     Discharge planning Services     Post Acute Care Choice:    Choice offered to:     DME Arranged:    DME Agency:     HH Arranged:    HH Agency:     Status of Service:     If discussed at [employer] Length of Stay Meetings, dates discussed:    Additional Comments:  Roberto Mcmaster A, RN 05/20/2016, 1:00 PM

## 2016-05-20 NOTE — Progress Notes (Signed)
Patient calm and cooperative throughout the night. Patient's parents were last night visiting with patient. Interaction between patient and family amicable.Sitter at bedside. Anselm Junglingonyers,Arwyn Besaw M

## 2016-05-20 NOTE — Progress Notes (Signed)
[facility] Physicians - [place] at [facility]   PATIENT NAME: Roberto Watts    MR#:  [id number]  DATE OF BIRTH:  Nov 14, 1990  SUBJECTIVE:  CHIEF COMPLAINT:  No chief complaint on file.  Slept well. Sitter at bedside. Tachycardia with standing up on orthostats  REVIEW OF SYSTEMS:    Review of Systems  Constitutional: Positive for malaise/fatigue. Negative for chills and fever.  HENT: Negative for sore throat.   Eyes: Negative for blurred vision, double vision and pain.  Respiratory: Negative for cough, hemoptysis, shortness of breath and wheezing.   Cardiovascular: Negative for chest pain, palpitations, orthopnea and leg swelling.  Gastrointestinal: Negative for abdominal pain, constipation, diarrhea, heartburn, nausea and vomiting.  Genitourinary: Negative for dysuria and hematuria.  Musculoskeletal: Negative for back pain and joint pain.  Skin: Negative for rash.  Neurological: Positive for dizziness. Negative for sensory change, speech change, focal weakness and headaches.  Endo/Heme/Allergies: Does not bruise/bleed easily.  Psychiatric/Behavioral: Positive for depression. The patient is not nervous/anxious.     DRUG ALLERGIES:   Allergies  Allergen Reactions  . Advil [Ibuprofen] Swelling  . Penicillins Swelling    VITALS:  Blood pressure 116/68, pulse (!) 107, temperature 97.7 F (36.5 C), temperature source Oral, resp. rate 18, height 5\' 11"  (1.803 m), weight 72.6 kg (160 lb), SpO2 97 %.  PHYSICAL EXAMINATION:   Physical Exam  GENERAL:  25 y.o.-year-old patient lying in the bed with no acute distress.  EYES: Pupils equal, round, reactive to light and accommodation. No scleral icterus. Extraocular muscles intact.  HEENT: Head atraumatic, normocephalic. Oropharynx and nasopharynx clear.  NECK:  Supple, no jugular venous distention. No thyroid enlargement, no tenderness.  LUNGS: Normal breath sounds bilaterally, no wheezing, rales, rhonchi. No use of  accessory muscles of respiration.  CARDIOVASCULAR: S1, S2 normal. No murmurs, rubs, or gallops.  ABDOMEN: Soft, nontender, nondistended. Bowel sounds present. No organomegaly or mass.  EXTREMITIES: No cyanosis, clubbing or edema b/l.    NEUROLOGIC: Cranial nerves II through XII are intact. No focal Motor or sensory deficits b/l.   PSYCHIATRIC: The patient is alert and awake SKIN: No obvious rash, lesion, or ulcer.   LABORATORY PANEL:   CBC  Recent Labs Lab 05/20/16 0610  WBC 5.6  HGB 13.4  HCT 37.3*  PLT 154   ------------------------------------------------------------------------------------------------------------------ Chemistries   Recent Labs Lab 05/18/16 1407 05/20/16 0610  NA 136 140  K 4.8 4.4  CL 106 108  CO2 27 29  GLUCOSE 141* 89  BUN 18 13  CREATININE 0.76 0.76  CALCIUM 8.8* 8.9  AST 27  --   ALT 40  --   ALKPHOS 88  --   BILITOT 1.6*  --    ------------------------------------------------------------------------------------------------------------------  Cardiac Enzymes No results for input(s): TROPONINI in the last 168 hours. ------------------------------------------------------------------------------------------------------------------  RADIOLOGY:  Ct Head Wo Contrast  Result Date: 05/18/2016 CLINICAL DATA:  24 year old male status post fall while walking, head injury against wall. Hypotension at the time. Initial encounter. EXAM: CT HEAD WITHOUT CONTRAST TECHNIQUE: Contiguous axial images were obtained from the base of the skull through the vertex without intravenous contrast. COMPARISON:  None. FINDINGS: Brain: Cerebral volume is normal. No midline shift, ventriculomegaly, mass effect, evidence of mass lesion, intracranial hemorrhage or evidence of cortically based acute infarction. Gray-white matter differentiation is within normal limits throughout the brain. Vascular: No suspicious intracranial vascular hyperdensity. Skull: Calvarium intact.  No  osseous abnormality identified. Sinuses/Orbits: Clear. Other: No scalp hematoma identified. Visualized orbit soft tissues  are within normal limits. IMPRESSION: Normal non contrast appearance of the brain. No acute traumatic injury identified. Electronically Signed   By: Odessa FlemingH  Hall M.D.   On: 05/18/2016 13:08   ASSESSMENT AND PLAN:   25 year old Caucasian gentleman admitted to the behavioral health unit for suicidal ideation who suffered a fall and hypotension  * Low cortisol- concern for adrenal insufficiency ACTH stimulation test done today morning. 30 minutes and 60 minutes ACTH levels drawn and [facility]. Called lab and likely results in 1-2 days. We'll start IV Decadron. Monitor on telemetry overnight. Continue IV fluids.  Discussed in detail regarding plan with patient's father over the phone. Answered all questions.  * Hypotension Likely dehydration and adrenal issues.  * Primary hypothyroidism Low TSH with increased free T4. Free T3 pending. Consult endocrine  * Fall/head trauma: CT head checked no acute abnormalities.  * Suicidal ideation: Care per psychiatry  No endocrinology coverage today. We will need to check with endocrine tomorrow regarding his thyroid and adrenal issues. Possible discharge back to behavioral health tomorrow with outpatient follow-up.  TOTAL TIME TAKING CARE OF THIS PATIENT: 25 minutes.   Milagros LollSudini, Elizabethann Lackey R M.D on 05/20/2016 at 12:11 PM  Between 7am to 6pm - Pager - [phone]  After 6pm go to [email or web address] - password [email or web address]  [facility]  Office  [phone]  CC: Primary care physician; Shelda Palim McGrath, MD  Note: This dictation was prepared with Dragon dictation along with smaller phrase technology. Any transcriptional errors that result from this process are unintentional.

## 2016-05-21 MED ORDER — HYDROCORTISONE 10 MG PO TABS
20.0000 mg | ORAL_TABLET | Freq: Two times a day (BID) | ORAL | Status: DC
  Administered 2016-05-21 – 2016-05-22 (×3): 20 mg via ORAL
  Filled 2016-05-21 (×4): qty 2

## 2016-05-21 NOTE — Progress Notes (Signed)
Spoke to multiple family members the patients father, mother and aunt. Aunt has been here mostly all day asking multiple questions and has been updated by Dr. Esaw GrandchildVachanni. Aunt requesting release of information that she wants patient to sign and send to an outside facility. Aunt informed that we cannot do that at this time while patient is IVC'd and will have to be ordered by Dr. Esaw GrandchildVachanni or Dr. Toni Amendlapacs. CSW updated patient father and aunt of the current plan of care. There was some misunderstanding that patient was discontinued from IVC status Dr. Toni Amendlapacs called and this writer was informed that this was not the case and that he will assess patient later on today, and at this time no changes will be made.

## 2016-05-21 NOTE — Progress Notes (Signed)
Patient dad at the bedside and has been updated by Dr. Esaw GrandchildVachanni. Patient ambulated around the unit multiples times. Patient denies being suicidal at the current time. No acute distress noted.

## 2016-05-21 NOTE — Clinical Social Work Note (Signed)
CSW not consulted for patient however, paitent's aunt and patient's father both at separate times this afternoon wanted clarification regarding if patient was discharged, if he was still IVC'd, and wanting to get [facility] to [facility] so that he can receive care from all needed teams. Patient's father and patient's aunt both expressed concern that patient was in need of an Endocrinologist and stated that they were both aware that [facility] is not able to provide this for patient. CSW reviewed patient's medical record and patient's nurse called Dr. Toni Amendlapacs and CSW called Dr. Esaw GrandchildVachanni. Dr. Toni Amendlapacs informed nursing that patient was to remain IVC'd until he comes to do his assessment today and that there were to be no changes. CSW paged Dr. Elisabeth PigeonVachhani and updated him regarding patients' father's concerns and requested he call patient's father which he did. Patient's father to update patient's aunt regarding his conversation with Dr. Madelon LipsVacchani. Patient's aunt and father both were made aware that Dr. Toni Amendlapacs would be returning at some point today to reassess patient and make a determination. CSW explained that patient was not discharged and clarified for them what Behavioral Medicine meant when they told them that patient had been discharged. CSW explained that patient was discharged from Behavioral Medicine when the patient was admitted to the medical unit.  York SpanielMonica Laszlo Ellerby MSW,LCSW [phone]

## 2016-05-21 NOTE — Progress Notes (Signed)
  [facility] Discharge Plan :  Will you be returning to the same living situation after discharge:  No. At discharge, do you have transportation home?: Yes,  patient transport Do you have the ability to pay for your medications: Yes,  insurance  Release of information consent forms completed and in the chart;  Patient's signature needed at discharge.  Patient to Follow up at:  Patient transferred to medical floors; anticipate that patient will be advised to follow up w regular providers; [facility] on 9/28 per record.   Next level of care provider has access to [facility] Link:yes On medical floors  Safety Planning and Suicide Prevention discussed: Yes,  w father  Have you used any form of tobacco in the last 30 days? (Cigarettes, Smokeless Tobacco, Cigars, and/or Pipes): No  Has patient been referred to the Quitline?: N/A patient is not a smoker  Patient has been referred for addiction treatment: N/A  Sallee Langenne C Sai Moura 05/21/2016, 1:07 PM

## 2016-05-21 NOTE — Progress Notes (Signed)
[facility] - [place] [facility]   PATIENT NAME: Roberto Watts    MR#:  [id number]  DATE OF BIRTH:  11/10/1990  SUBJECTIVE:  CHIEF COMPLAINT:  No chief complaint on file.  Slept well. Sitter at bedside. Tachycardia with standing up on orthostats, But BP did not drop much.  REVIEW OF SYSTEMS:    Review of Systems  Constitutional: Positive for malaise/fatigue. Negative for chills and fever.  HENT: Negative for sore throat.   Eyes: Negative for blurred vision, double vision and pain.  Respiratory: Negative for cough, hemoptysis, shortness of breath and wheezing.   Cardiovascular: Negative for chest pain, palpitations, orthopnea and leg swelling.  Gastrointestinal: Negative for abdominal pain, constipation, diarrhea, heartburn, nausea and vomiting.  Genitourinary: Negative for dysuria and hematuria.  Musculoskeletal: Negative for back pain and joint pain.  Skin: Negative for rash.  Neurological: Positive for dizziness. Negative for sensory change, speech change, focal weakness and headaches.  Endo/Heme/Allergies: Does not bruise/bleed easily.  Psychiatric/Behavioral: Positive for depression. The patient is not nervous/anxious.     DRUG ALLERGIES:   Allergies  Allergen Reactions  . Advil [Ibuprofen] Swelling  . Penicillins Swelling    VITALS:  Blood pressure 112/66, pulse (!) 118, temperature 97.4 F (36.3 C), temperature source Oral, resp. rate 18, height 5\' 11"  (1.803 m), weight 72.6 kg (160 lb), SpO2 97 %.  PHYSICAL EXAMINATION:   Physical Exam  GENERAL:  25 y.o.-year-old patient lying in the bed with no acute distress.  EYES: Pupils equal, round, reactive to light and accommodation. No scleral icterus. Extraocular muscles intact.  HEENT: Head atraumatic, normocephalic. Oropharynx and nasopharynx clear.  NECK:  Supple, no jugular venous distention. No thyroid enlargement, no tenderness.  LUNGS: Normal breath sounds bilaterally, no wheezing,  rales, rhonchi. No use of accessory muscles of respiration.  CARDIOVASCULAR: S1, S2 normal. No murmurs, rubs, or gallops.  ABDOMEN: Soft, nontender, nondistended. Bowel sounds present. No organomegaly or mass.  EXTREMITIES: No cyanosis, clubbing or edema b/l.    NEUROLOGIC: Cranial nerves II through XII are intact. No focal Motor or sensory deficits b/l.   PSYCHIATRIC: The patient is alert and awake SKIN: No obvious rash, lesion, or ulcer.   LABORATORY PANEL:   CBC  Recent Labs Lab 05/20/16 0610  WBC 5.6  HGB 13.4  HCT 37.3*  PLT 154   ------------------------------------------------------------------------------------------------------------------ Chemistries   Recent Labs Lab 05/18/16 1407 05/20/16 0610  NA 136 140  K 4.8 4.4  CL 106 108  CO2 27 29  GLUCOSE 141* 89  BUN 18 13  CREATININE 0.76 0.76  CALCIUM 8.8* 8.9  AST 27  --   ALT 40  --   ALKPHOS 88  --   BILITOT 1.6*  --    ------------------------------------------------------------------------------------------------------------------  Cardiac Enzymes No results for input(s): TROPONINI in the last 168 hours. ------------------------------------------------------------------------------------------------------------------  RADIOLOGY:  No results found. ASSESSMENT AND PLAN:   25 year old Caucasian gentleman admitted to the behavioral health unit for suicidal ideation who suffered a fall and hypotension  * Low cortisol- concern for adrenal insufficiency ACTH stimulation test done 05/20/16 morning. 30 minutes and 60 minutes ACTH levels does not show much rise in cortisol level.  started IV Decadron.   I spoke to Dr. Tedd Sias On phone- she suggested to change oral hydrocortisone 20 mg BID.  Advised to continue that until he see endocrinology clinic after discharge in 2 weeks.  Discussed in detail regarding plan with patient's father over the phone. Answered all questions.  *  Hypotension Likely dehydration  and adrenal issues.   BP stable now.  * Primary hypothyroidism Low TSH with increased free T4. Free T3 . As per Dr. Tedd SiasSolum, check again in 2 days- and if stays high- call her again ( In endo clinic) and he may need medication.  * High prolactin   This could be due to Haldol and oxcarbazepine as per Dr. Tedd SiasSolum.   No further workup at this time, but she will follow in office.  * Fall/head trauma: CT head checked no acute abnormalities.  * Suicidal ideation: Care per psychiatry  No endocrinology coverage today. We need to call endocrinology clinic for any questions. Possible discharge back to behavioral health tomorrow with outpatient follow-up.  TOTAL TIME TAKING CARE OF THIS PATIENT: 35 minutes.  Called endocrinologist and pt's father on phone.  Altamese DillingVACHHANI, Naasia Weilbacher M.D on 05/21/2016 at 1:08 PM  Between 7am to 6pm - Pager - [phone]  After 6pm go to [email or web address] - password [email or web address]  Fabio Neighborsagle [facility]  Office  [phone]  CC: Primary care physician; Shelda Palim McGrath, MD  Note: This dictation was prepared with Dragon dictation along with smaller phrase technology. Any transcriptional errors that result from this process are unintentional.

## 2016-05-22 ENCOUNTER — Inpatient Hospital Stay
Admission: AD | Admit: 2016-05-22 | Discharge: 2016-05-23 | DRG: 885 | Disposition: A | Discharge status: Home / Self Care | Payer: BLUE CROSS/BLUE SHIELD | Source: Intra-hospital | Attending: Psychiatry | Admitting: Psychiatry

## 2016-05-22 DIAGNOSIS — R45851 Suicidal ideations: Secondary | ICD-10-CM | POA: Diagnosis present

## 2016-05-22 DIAGNOSIS — Z818 Family history of other mental and behavioral disorders: Secondary | ICD-10-CM

## 2016-05-22 DIAGNOSIS — Z79899 Other long term (current) drug therapy: Secondary | ICD-10-CM

## 2016-05-22 DIAGNOSIS — F419 Anxiety disorder, unspecified: Secondary | ICD-10-CM | POA: Diagnosis present

## 2016-05-22 DIAGNOSIS — F333 Major depressive disorder, recurrent, severe with psychotic symptoms: Secondary | ICD-10-CM | POA: Diagnosis not present

## 2016-05-22 DIAGNOSIS — F209 Schizophrenia, unspecified: Principal | ICD-10-CM | POA: Diagnosis present

## 2016-05-22 DIAGNOSIS — E2740 Unspecified adrenocortical insufficiency: Secondary | ICD-10-CM | POA: Diagnosis present

## 2016-05-22 DIAGNOSIS — F29 Unspecified psychosis not due to a substance or known physiological condition: Secondary | ICD-10-CM | POA: Diagnosis present

## 2016-05-22 DIAGNOSIS — Z9889 Other specified postprocedural states: Secondary | ICD-10-CM

## 2016-05-22 DIAGNOSIS — E0590 Thyrotoxicosis, unspecified without thyrotoxic crisis or storm: Secondary | ICD-10-CM | POA: Diagnosis present

## 2016-05-22 DIAGNOSIS — Z880 Allergy status to penicillin: Secondary | ICD-10-CM

## 2016-05-22 HISTORY — DX: Thyrotoxicosis, unspecified without thyrotoxic crisis or storm: E05.90

## 2016-05-22 LAB — HEPATIC FUNCTION PANEL
ALT: 34 U/L (ref 17–63)
AST: 25 U/L (ref 15–41)
Albumin: 3.9 g/dL (ref 3.5–5.0)
Alkaline Phosphatase: 83 U/L (ref 38–126)
BILIRUBIN INDIRECT: 0.8 mg/dL (ref 0.3–0.9)
Bilirubin, Direct: 0.1 mg/dL (ref 0.1–0.5)
TOTAL PROTEIN: 6.2 g/dL — AB (ref 6.5–8.1)
Total Bilirubin: 0.9 mg/dL (ref 0.3–1.2)

## 2016-05-22 LAB — T4, FREE: FREE T4: 1.57 ng/dL — AB (ref 0.61–1.12)

## 2016-05-22 LAB — THYROID ANTIBODIES: THYROID PEROXIDASE ANTIBODY: 17 [IU]/mL (ref 0–34)

## 2016-05-22 LAB — TSH: TSH: 0.01 u[IU]/mL — ABNORMAL LOW (ref 0.350–4.500)

## 2016-05-22 MED ORDER — ALUM & MAG HYDROXIDE-SIMETH 200-200-20 MG/5ML PO SUSP: 30.0000 mL | ORAL | Status: DC | PRN

## 2016-05-22 MED ORDER — OXCARBAZEPINE 300 MG PO TABS
150.0000 mg | ORAL_TABLET | Freq: Two times a day (BID) | ORAL | Status: DC
  Administered 2016-05-22 – 2016-05-23 (×2): 150 mg via ORAL
  Filled 2016-05-22 (×2): qty 1

## 2016-05-22 MED ORDER — HYDROCORTISONE 10 MG PO TABS
20.0000 mg | ORAL_TABLET | Freq: Two times a day (BID) | ORAL | Status: DC
  Administered 2016-05-22 – 2016-05-23 (×2): 20 mg via ORAL
  Filled 2016-05-22: qty 1
  Filled 2016-05-22: qty 2

## 2016-05-22 MED ORDER — HALOPERIDOL 2 MG PO TABS
2.0000 mg | ORAL_TABLET | Freq: Every day | ORAL | Status: DC
  Administered 2016-05-22: 2 mg via ORAL
  Filled 2016-05-22: qty 1

## 2016-05-22 MED ORDER — MAGNESIUM HYDROXIDE 400 MG/5ML PO SUSP: 30.0000 mL | Freq: Every day | ORAL | Status: DC | PRN

## 2016-05-22 MED ORDER — ACETAMINOPHEN 325 MG PO TABS: 650.0000 mg | ORAL_TABLET | Freq: Four times a day (QID) | ORAL | Status: DC | PRN

## 2016-05-22 MED ORDER — BENZTROPINE MESYLATE 1 MG PO TABS
0.5000 mg | ORAL_TABLET | Freq: Two times a day (BID) | ORAL | Status: DC
  Administered 2016-05-22 – 2016-05-23 (×2): 0.5 mg via ORAL
  Filled 2016-05-22 (×2): qty 1

## 2016-05-22 MED ORDER — DULOXETINE HCL 30 MG PO CPEP
30.0000 mg | ORAL_CAPSULE | Freq: Every day | ORAL | Status: DC
  Administered 2016-05-23: 30 mg via ORAL
  Filled 2016-05-22: qty 1

## 2016-05-22 MED ORDER — CLONAZEPAM 0.5 MG PO TABS: 0.5000 mg | ORAL_TABLET | Freq: Three times a day (TID) | ORAL | 0 refills | Status: DC

## 2016-05-22 MED ORDER — HYDROCORTISONE 20 MG PO TABS: 20.0000 mg | ORAL_TABLET | Freq: Two times a day (BID) | ORAL | 0 refills | Status: DC

## 2016-05-22 MED ORDER — CLONAZEPAM 0.5 MG PO TABS
0.5000 mg | ORAL_TABLET | Freq: Three times a day (TID) | ORAL | Status: DC | PRN
  Administered 2016-05-22: 0.5 mg via ORAL
  Filled 2016-05-22: qty 1

## 2016-05-22 NOTE — Progress Notes (Signed)
[facility] IV removed and discharge papers discussed with Pt and father at bedside. [facility]

## 2016-05-22 NOTE — BHH Suicide Risk Assessment (Signed)
BHH Admission Suicide Risk Assessment   Nursing information obtained from:    Demographic factors:    [facility]:    Loss Factors:    Historical Factors:    Risk Reduction Factors:     Total Time spent with patient: 1 hour Principal Problem: Schizophrenia spectrum disorder with psychotic disorder type not yet determined [facility](HCC) Diagnosis:   Patient Active Problem List   Diagnosis Date Noted  . Adrenal insufficiency (HCC) [E27.40] 05/22/2016  . Hyperthyroidism [E05.90] 05/22/2016  . Orthostasis [I95.1] 05/19/2016  . Schizophrenia spectrum disorder with psychotic disorder type not yet determined (HCC) [F20.89, F29] 05/17/2016  . Bipolar I disorder, most recent episode mixed (HCC) [F31.60] 05/17/2016  . 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  Continued Clinical Symptoms:  Alcohol Use Disorder Identification Test Final Score (AUDIT): 0 The "Alcohol Use Disorders Identification Test", Guidelines for Use in Primary Care, Second Edition.  World Science writerHealth Organization [facility](WHO). Score between 0-7:  no or low risk or alcohol related problems. Score between 8-15:  moderate risk of alcohol related problems. Score between 16-19:  high risk of alcohol related problems. Score 20 or above:  warrants further diagnostic evaluation for alcohol dependence and treatment.   CLINICAL FACTORS:   Depression:   Impulsivity Currently Psychotic   Musculoskeletal: Strength & Muscle Tone: within normal limits Gait & Station: normal Patient leans: N/A  Psychiatric Specialty Exam: Physical Exam  Nursing note and vitals reviewed.   Review of Systems  All other systems reviewed and are negative.   Blood pressure 134/73, pulse 98, temperature 98.5 F (36.9 C), temperature source Oral, resp. rate 16, height 5\' 11"  (1.803 m), weight 73.5 kg (162 lb), SpO2 98 %.Body mass index is 22.59 kg/m.  General Appearance: Casual  Eye Contact:  Good   Speech:  Clear and Coherent  Volume:  Normal  Mood:  Anxious  Affect:  Appropriate  Thought Process:  Goal Directed  Orientation:  Full (Time, Place, and Person)  Thought Content:  WDL  Suicidal Thoughts:  No  Homicidal Thoughts:  No  Memory:  Immediate;   Fair Recent;   Fair Remote;   Fair  Judgement:  Impaired  Insight:  Shallow  Psychomotor Activity:  Normal  Concentration:  Concentration: Fair and Attention Span: Fair  Recall:  [employer]Fair  Fund of Knowledge:  Fair  Language:  Fair  Akathisia:  No  Handed:  Right  AIMS (if indicated):     Assets:  Communication Skills Desire for Improvement Financial Resources/Insurance Housing Resilience Social Support  ADL's:  Intact  Cognition:  WNL  Sleep:         COGNITIVE FEATURES THAT CONTRIBUTE TO RISK:  None    SUICIDE RISK:   Minimal: No identifiable suicidal ideation.  Patients presenting with no risk factors but with morbid ruminations; may be classified as minimal risk based on the severity of the depressive symptoms   PLAN OF CARE: Hospital admission, medication management, discharge planning.  Roberto Watts is a 25 year old male with a history of depression, psychosis and mood instability transferred [facility] where he was just diagnosed with adrenal insufficiency and hyperthyroidism.  1. Suicidal ideation. The patient adamantly dnies any thoughts, intention, or plans to hurt himself or others. He is able to contract for safety. He is forward thinking and optimistic about the future.  2. Mood/psychosis. We continued Trileptal for mood stabilization, Cymbalta for depression and low dose Haldol for psychosis.  3. Anxiety. He was  started on low dose Klonopin.   4. Adrenal insufficiency. He was started on Hydrocortisone 2 mg twice daily.  5. Abnormal thyroid test indicate hyperthyroidism.  6. Disposition. He was discharged to home with his parents. He will follow up with [facility] for medication management and  with Dr. Tedd Sias, Endocrinology.    I certify that inpatient services furnished can reasonably be expected to improve the patient's condition.  Kristine Linea, MD 05/22/2016, 10:17 PM

## 2016-05-22 NOTE — Progress Notes (Addendum)
Admission Note:  Patient arrived to the unit at approximately 1330 pm.  Patient arrived to unit dressed in t-shirt and shorts with a steady gait.  Patient speech was logical and coherent and affect was flat but appropriate.  Patient was cooperative with the assessment and interacted appropriately with staff and peers.  Patient conversation was relevant, thoughts logical and organized and patient denies SI/AVH/HI.  Patient clothing searched and logged by staff and patient oriented to the unit and his room.  Patient skin check completed with another staff member present and patient had no signs of skin breakdown or skin abnormalities present.  Patient had multiple tattoos located on his back, left leg and left chest.

## 2016-05-22 NOTE — BHH Group Notes (Signed)
BHH Group Notes:  (Nursing/MHT/Case Management/Adjunct)  Date:  05/22/2016  Time:  4:09 PM  Type of Therapy:  Psychoeducational Skills  Participation Level:  Did Not Attend  Lynelle SmokeCara [facility] 05/22/2016, 4:09 PM

## 2016-05-22 NOTE — Tx Team (Signed)
Initial Treatment Plan 05/22/2016 1:54 PM Roberto LameBrandyn M Krempasky AOZ:[id number]    PATIENT STRESSORS: Marital or family conflict Substance abuse   PATIENT STRENGTHS: Active sense of humor Capable of independent living Communication skills   PATIENT IDENTIFIED PROBLEMS: Bipolar I Disorder  Substance Abuse  Suicidal Ideation                 DISCHARGE CRITERIA:  Ability to meet basic life and health needs Improved stabilization in mood, thinking, and/or behavior Motivation to continue treatment in a less acute level of care  PRELIMINARY DISCHARGE PLAN: Outpatient therapy  PATIENT/FAMILY INVOLVEMENT: This treatment plan has been presented to and reviewed with the patient, Roberto Watts, and/or family member.  The patient and family have been given the opportunity to ask questions and make suggestions.  Santo HeldNakisha D Arelys Glassco, RN 05/22/2016, 1:54 PM

## 2016-05-22 NOTE — Consult Note (Signed)
[facility] Face-to-Face Psychiatry Consult   Reason for Consult:  Consult for this 25 year old man with a history of recurrent depression sent here from [place] because of suicidal statements. Referring Physician:  McShane Patient Identification: BOAZ BERISHA MRN:  [id number] Principal Diagnosis: <principal problem not specified> Diagnosis:   Patient Active Problem List   Diagnosis Date Noted  . Orthostasis [I95.1] 05/19/2016  . Suicidal ideation [R45.851] 05/17/2016  . Schizophrenia spectrum disorder with psychotic disorder type not yet determined ([place]) [F20.89, F29] 05/17/2016  . Bipolar I disorder, most recent episode mixed ([place]) [F31.60] 05/17/2016  . 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 male patient admitted with "they must think I'm depressed".   Follow-up for September 5 Tuesday. Patient seen. He was awake and alert. Denied suicidal thoughts. Said he was able to get up and walk around today without getting dizzy. Patient is now being given corticosteroids replacement. Affect seems to be stable and calm.  HPI:  Patient interviewed. Chart reviewed. Patient known to me from previous encounters as well. This is a 25 year old man with a history of mental health problems who was sent here on IVC from [facility] counseling. He went there for evaluation and told them that he was having active suicidal thoughts. Apparently he then left there and went back home. They had become alarmed by his statements and filed commitment papers. Police picked him up from home and brought him in. Patient tells me that he feels like he wishes he were dead all of the time. Despite saying this he says that he doesn't think he is depressed. He talks about how he's upset because he doesn't have a job. Evidently he quit his job a few months ago because he felt like they were treating him badly. His description of it sounds  rather paranoid. He says since then he just been staying with his parents not doing much although he claims to be looking for work. A lot. Eats a lot. Not taking any current psychiatric medicine. Had been taking Lexapro Trileptal and and in [place] injection at some point up to some months ago but then discontinued them. Patient repeats to me that he wishes he were dead all the time but denies any plan to act on it. Denies any hallucinations or psychotic symptoms. He is not a very effective historian rather vague in his descriptions.  Social history: Patient lives with his biological parents. He is not currently working. Not going for any outpatient treatment. Says he spends most of his day doing not much of anything.  Medical history: Denies any significant known ongoing medical problems.  Substance abuse history: Patient has a past history of abuse of drugs but claims that he has not been using any alcohol or drugs anytime recently.  Past Psychiatric History: Patient has a past history of psychiatric admission and odd symptoms including symptoms of depression with paranoia and psychotic-like behavior. His insight has been poor about it. He had been treated in the past with a combination of antidepressants and antipsychotics however with pretty good response. Not clear that he's ever had a true bipolar episode. Doesn't have much insight into his paranoid and psychotic thinking.  Risk to Self: Is patient at risk for suicide?: No Risk to Others:   Prior Inpatient Therapy:   Prior Outpatient Therapy:    Past Medical History:  Past Medical History:  Diagnosis Date  .  Medical history non-contributory     Past Surgical History:  Procedure Laterality Date  . TYMPANOSTOMY TUBE PLACEMENT     Family History:  Family History  Problem Relation Age of Onset  . Diabetes Neg Hx    Family Psychiatric  History: Denies knowing of any family history of any mental health problems Social History:  History   Alcohol Use No     History  Drug Use  . Types: Cocaine, Marijuana    Social History   Social History  . Marital status: Single    Spouse name: N/A  . Number of children: N/A  . Years of education: N/A   Social History Main Topics  . Smoking status: Never Smoker  . Smokeless tobacco: Never Used  . Alcohol use No  . Drug use:     Types: Cocaine, Marijuana  . Sexual activity: No   Other Topics Concern  . None   Social History Narrative  . None   Additional Social History:    Allergies:   Allergies  Allergen Reactions  . Advil [Ibuprofen] Swelling  . Penicillins Swelling    Labs:  Results for orders placed or performed during the hospital encounter of 05/19/16 (from the past 48 hour(s))  Basic metabolic panel     Status: Abnormal   Collection Time: 05/20/16  6:10 AM  Result Value Ref Range   Sodium 140 135 - 145 mmol/L   Potassium 4.4 3.5 - 5.1 mmol/L   Chloride 108 101 - 111 mmol/L   CO2 29 22 - 32 mmol/L   Glucose, Bld 89 65 - 99 mg/dL   BUN 13 6 - 20 mg/dL   Creatinine, Ser 0.76 0.61 - 1.24 mg/dL   Calcium 8.9 8.9 - 10.3 mg/dL   GFR calc non Af Amer >60 >60 mL/min   GFR calc Af Amer >60 >60 mL/min    Comment: (NOTE) The eGFR has been calculated using the CKD EPI equation. This calculation has not been validated in all clinical situations. eGFR's persistently <60 mL/min signify possible Chronic Kidney Disease.    Anion gap 3 (L) 5 - 15  CBC     Status: Abnormal   Collection Time: 05/20/16  6:10 AM  Result Value Ref Range   WBC 5.6 3.8 - 10.6 K/uL   RBC 3.98 (L) 4.40 - 5.90 MIL/uL   Hemoglobin 13.4 13.0 - 18.0 g/dL   HCT 37.3 (L) 40.0 - 52.0 %   MCV 93.8 80.0 - 100.0 fL   MCH 33.7 26.0 - 34.0 pg   MCHC 35.9 32.0 - 36.0 g/dL   RDW 11.8 11.5 - 14.5 %   Platelets 154 150 - 440 K/uL  ACTH stimulation, 3 time points     Status: None   Collection Time: 05/20/16  6:10 AM  Result Value Ref Range   Cortisol, Base 12.8 ug/dL    Comment: NO NORMAL RANGE  ESTABLISHED FOR THIS TEST   Cortisol, 30 Min 13.4 ug/dL   Cortisol, 60 Min 17.0 ug/dL    Comment: Performed at [facility]    Current Facility-Administered Medications  Medication Dose Route Frequency Provider Last Rate Last Dose  . acetaminophen (TYLENOL) tablet 650 mg  650 mg Oral Q6H PRN Lytle Butte, MD       Or  . acetaminophen (TYLENOL) suppository 650 mg  650 mg Rectal Q6H PRN Lytle Butte, MD      . clonazePAM Bobbye [place]) tablet 0.5 mg  0.5 mg Oral TID Steva Colder  Nicolasa Ducking, MD   0.5 mg at 05/21/16 2206  . DULoxetine (CYMBALTA) DR capsule 30 mg  30 mg Oral Daily Lytle Butte, MD   30 mg at 05/21/16 1036  . enoxaparin (LOVENOX) injection 40 mg  40 mg Subcutaneous Q24H Lytle Butte, MD      . haloperidol (HALDOL) tablet 2 mg  2 mg Oral QHS Lytle Butte, MD   2 mg at 05/21/16 2206  . hydrocortisone (CORTEF) tablet 20 mg  20 mg Oral BID Vaughan Basta, MD   20 mg at 05/21/16 2206  . ondansetron (ZOFRAN) tablet 4 mg  4 mg Oral Q6H PRN Lytle Butte, MD       Or  . ondansetron [facility]) injection 4 mg  4 mg Intravenous Q6H PRN Lytle Butte, MD      . Oxcarbazepine (TRILEPTAL) tablet 300 mg  300 mg Oral BID Lytle Butte, MD   300 mg at 05/21/16 2206  . sodium chloride flush (NS) 0.9 % injection 3 mL  3 mL Intravenous Q12H Lytle Butte, MD   3 mL at 05/21/16 2207    Musculoskeletal: Strength & Muscle Tone: within normal limits Gait & Station: normal Patient leans: N/A  Psychiatric Specialty Exam: Physical Exam  Nursing note and vitals reviewed. Constitutional: He appears well-developed and well-nourished.  HENT:  Head: Normocephalic and atraumatic.  Eyes: Conjunctivae are normal. Pupils are equal, round, and reactive to light.  Neck: Normal range of motion.  Cardiovascular: Regular rhythm and normal heart sounds.   Respiratory: Effort normal. No respiratory distress.  GI: Soft.  Musculoskeletal: Normal range of motion.  Neurological: He is alert.  Skin: Skin is  warm and dry.  Psychiatric: His mood appears anxious. His affect is blunt and inappropriate. His speech is delayed. He is slowed. Thought content is paranoid. Cognition and memory are normal. He expresses impulsivity. He exhibits a depressed mood. He expresses suicidal ideation.    Review of Systems  Constitutional: Negative.   HENT: Negative.   Eyes: Negative.   Respiratory: Negative.   Cardiovascular: Negative.   Gastrointestinal: Negative.   Musculoskeletal: Negative.   Skin: Negative.   Neurological: Negative.   Psychiatric/Behavioral: Negative for depression and suicidal ideas.    Blood pressure 129/67, pulse 96, temperature 98.1 F (36.7 C), temperature source Oral, resp. rate 18, height _0  (1.803 m), weight 72.6 kg (160 lb), SpO2 98 %.Body mass index is 22.32 kg/m.  General Appearance: Casual  Eye Contact:  Good  Speech:  Clear and Coherent  Volume:  Decreased  Mood:  Euthymic  Affect:  Congruent  Thought Process:  Goal Directed  Orientation:  Full (Time, Place, and Person)  Thought Content:  Paranoid Ideation  Suicidal Thoughts:  No  Homicidal Thoughts:  No  Memory:  Immediate;   Good Recent;   Fair Remote;   Fair  Judgement:  Fair  Insight:  Fair  Psychomotor Activity:  Decreased  Concentration:  Concentration: Good  Recall:  [place] of Knowledge:  Fair  Language:  Fair  Akathisia:  No  Handed:  Right  AIMS (if indicated):     Assets:  Housing Resilience Social Support  ADL's:  Intact  Cognition:  WNL  Sleep:        Treatment Plan Summary: Daily contact with patient to assess and evaluate symptoms and progress in treatment, Medication management and Plan Mental he seems to be doing much better. Stable and calm behavior. Patient says he would prefer  to be discharged or transferred to [facility] although he is still under commitment. We will reevaluate how he is doing tomorrow. I did not get a chance to talk to his family today but he is doing much better  right now. We can reassess tomorrow about appropriate plan whether it be to go back down to psychiatry or be discharged.  Disposition: Recommend psychiatric Inpatient admission when medically cleared. Supportive therapy provided about ongoing stressors.  Alethia Berthold, MD 05/22/2016 12:17 AM

## 2016-05-22 NOTE — Progress Notes (Signed)
D:  Patient denies SI/AVH/HI.  Patient has remained in bed for most of the shift. A:  Patient encouraged to go to groups.  Patient offered support and encouragement.   R:  Patient safety maintained with 15 minute checks.

## 2016-05-22 NOTE — Consult Note (Signed)
[facility] Face-to-Face Psychiatry Consult   Reason for Consult:  Consult for this 25 year old man with a history of recurrent depression sent here from [facility] because of suicidal statements. 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 Orthostasis [I95.1] 05/19/2016  . Suicidal ideation [R45.851] 05/17/2016  . Schizophrenia spectrum disorder with psychotic disorder type not yet determined (HCC) [F20.89, F29] 05/17/2016  . Bipolar I disorder, most recent episode mixed (HCC) [F31.60] 05/17/2016  . 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 male patient admitted with "they must think I'm depressed".  Just another quick follow-up. I spoke with the patient and his father. Patient has no current complaints. He has been ambulating without any dizziness. He states that his mood is feeling better and denies psychotic symptoms. I explained the tentative diagnosis. I explained that I have spoken to the psychiatrist at [facility] and that we have a promise from Dr.Solam to see him as an outpatient.Marland Kitchen  HPI:  Patient interviewed. Chart reviewed. Patient known to me from previous encounters as well. This is a 25 year old man with a history of mental health problems who was sent here on IVC from [facility] counseling. He went there for evaluation and told them that he was having active suicidal thoughts. Apparently he then left there and went back home. They had become alarmed by his statements and filed commitment papers. Police picked him up from home and brought him in. Patient tells me that he feels like he wishes he were dead all of the time. Despite saying this he says that he doesn't think he is depressed. He talks about how he's upset because  he doesn't have a job. Evidently he quit his job a few months ago because he felt like they were treating him badly. His description of it sounds rather paranoid. He says since then he just been staying with his parents not doing much although he claims to be looking for work. A lot. Eats a lot. Not taking any current psychiatric medicine. Had been taking Lexapro Trileptal and and in V[place] injection at some point up to some months ago but then discontinued them. Patient repeats to me that he wishes he were dead all the time but denies any plan to act on it. Denies any hallucinations or psychotic symptoms. He is not a very effective historian rather vague in his descriptions.  Social history: Patient lives with his biological parents. He is not currently working. Not going for any outpatient treatment. Says he spends most of his day doing not much of anything.  Medical history: Denies any significant known ongoing medical problems.  Substance abuse history: Patient has a past history of abuse of drugs but claims that he has not been using any alcohol or drugs anytime recently.  Past Psychiatric History: Patient has a past history of psychiatric admission and odd symptoms including symptoms of depression with paranoia and psychotic-like behavior. His insight has been poor about it. He had been treated in the past with a combination of antidepressants and antipsychotics however with pretty good response. Not clear that he's ever had a true bipolar episode. Doesn't have much insight into his paranoid and psychotic thinking.  Risk to Self:   Risk to Others:   Prior Inpatient  Therapy:   Prior Outpatient Therapy:    Past Medical History:  Past Medical History:  Diagnosis Date  . Medical history non-contributory     Past Surgical History:  Procedure Laterality Date  . TYMPANOSTOMY TUBE PLACEMENT     Family History:  Family History  Problem Relation Age of Onset  . Diabetes Neg Hx    Family  Psychiatric  History: Denies knowing of any family history of any mental health problems Social History:  History  Alcohol Use No     History  Drug Use  . Types: Cocaine, Marijuana    Social History   Social History  . Marital status: Single    Spouse name: N/A  . Number of children: N/A  . Years of education: N/A   Social History Main Topics  . Smoking status: Never Smoker  . Smokeless tobacco: Never Used  . Alcohol use No  . Drug use:     Types: Cocaine, Marijuana  . Sexual activity: No   Other Topics Concern  . Not on file   Social History Narrative  . No narrative on file   Additional Social History:    Allergies:   Allergies  Allergen Reactions  . Advil [Ibuprofen] Swelling  . Penicillins Swelling    Labs:  Results for orders placed or performed during the hospital encounter of 05/19/16 (from the past 48 hour(s))  TSH     Status: Abnormal   Collection Time: 05/22/16  5:09 AM  Result Value Ref Range   TSH 0.010 (L) 0.350 - 4.500 uIU/mL  T4, free     Status: Abnormal   Collection Time: 05/22/16  5:09 AM  Result Value Ref Range   Free T4 1.57 (H) 0.61 - 1.12 ng/dL    Comment: (NOTE) Biotin ingestion may interfere with free T4 tests. If the results are inconsistent with the TSH level, previous test results, or the clinical presentation, then consider biotin interference. If needed, order repeat testing after stopping biotin.   Hepatic function panel     Status: Abnormal   Collection Time: 05/22/16  5:09 AM  Result Value Ref Range   Total Protein 6.2 (L) 6.5 - 8.1 g/dL   Albumin 3.9 3.5 - 5.0 g/dL   AST 25 15 - 41 U/L   ALT 34 17 - 63 U/L   Alkaline Phosphatase 83 38 - 126 U/L   Total Bilirubin 0.9 0.3 - 1.2 mg/dL   Bilirubin, Direct 0.1 0.1 - 0.5 mg/dL   Indirect Bilirubin 0.8 0.3 - 0.9 mg/dL    No current facility-administered medications for this encounter.     Musculoskeletal: Strength & Muscle Tone: within normal limits Gait & Station:  normal Patient leans: N/A  Psychiatric Specialty Exam: Physical Exam  Nursing note and vitals reviewed. Constitutional: He appears well-developed and well-nourished.  HENT:  Head: Normocephalic and atraumatic.  Eyes: Conjunctivae are normal. Pupils are equal, round, and reactive to light.  Neck: Normal range of motion.  Cardiovascular: Regular rhythm and normal heart sounds.   Respiratory: Effort normal. No respiratory distress.  GI: Soft.  Musculoskeletal: Normal range of motion.  Neurological: He is alert.  Skin: Skin is warm and dry.  Psychiatric: His mood appears anxious. His affect is blunt and inappropriate. His speech is delayed. He is slowed. Thought content is paranoid. Cognition and memory are normal. He expresses impulsivity. He exhibits a depressed mood. He expresses suicidal ideation.    Review of Systems  Constitutional: Negative.   HENT: Negative.  Eyes: Negative.   Respiratory: Negative.   Cardiovascular: Negative.   Gastrointestinal: Negative.   Musculoskeletal: Negative.   Skin: Negative.   Neurological: Negative.   Psychiatric/Behavioral: Negative for depression and suicidal ideas.    There were no vitals taken for this visit.There is no height or weight on file to calculate BMI.  General Appearance: Casual  Eye Contact:  Good  Speech:  Clear and Coherent  Volume:  Decreased  Mood:  Euthymic  Affect:  Congruent  Thought Process:  Goal Directed  Orientation:  Full (Time, Place, and Person)  Thought Content:  Paranoid Ideation  Suicidal Thoughts:  No  Homicidal Thoughts:  No  Memory:  Immediate;   Good Recent;   Fair Remote;   Fair  Judgement:  Fair  Insight:  Fair  Psychomotor Activity:  Decreased  Concentration:  Concentration: Good  Recall:  Fair  Fund of Knowledge:  Fair  Language:  Fair  Akathisia:  No  Handed:  Right  AIMS (if indicated):     Assets:  Housing Resilience Social Support  ADL's:  Intact  Cognition:  WNL  Sleep:         Treatment Plan Summary: Daily contact with patient to assess and evaluate symptoms and progress in treatment, Medication management and Plan I spoke this morning with Dr. Noreene Filbert at [facility] in [place]. Her pager number is [phone]. She expressed willingness to see the patient as an outpatient and was glad to talk to providers here prior to discharge if needed. Patient is now being transferred back to the psychiatric service for stabilization. Patient and father both expressed an understanding of the plan and are not argumentative area no other change to medicine right now.  Disposition: Recommend psychiatric Inpatient admission when medically cleared. Supportive therapy provided about ongoing stressors.  Mordecai Rasmussen, MD 05/22/2016 1:21 PM

## 2016-05-22 NOTE — Plan of Care (Signed)
Problem: Safety: Goal: Ability to remain free from injury will improve Outcome: Progressing Patient has remained free from injury this shift.  Patient provided with a safe environment and rounds conducted every 15 minutes to maintain safety.

## 2016-05-22 NOTE — Discharge Summary (Addendum)
[facility] [place] [facility]   PATIENT NAME: Roberto ReedyBrandyn Watts    MR#:  [id number]  DATE OF BIRTH:  11/07/1990  DATE OF ADMISSION:  05/19/2016 ADMITTING PHYSICIAN: Milagros LollSrikar Sudini, MD  DATE OF DISCHARGE: 05/22/16  PRIMARY CARE PHYSICIAN: Shelda Palim McGrath, MD    ADMISSION DIAGNOSIS:  Rapid Heart Rate  DISCHARGE DIAGNOSIS:  Hypotension-resolved Adrenal insufficiency Elevated TSH  SECONDARY DIAGNOSIS:   Past Medical History:  Diagnosis Date  . Medical history non-contributory     HOSPITAL COURSE:   25 year old Caucasian gentleman admitted to the behavioral health unit for suicidal ideation who suffered a fall and hypotension  * Low cortisol- concern for adrenal insufficiency ACTH stimulation test done 05/20/16 morning. 30 minutes and 60 minutes ACTH levels does not show much rise in cortisol level.  Dr. Tedd SiasSolum recommends oral hydrocortisone 20 mg BID.  Advised to continue that until he see endocrinology clinic after discharge in 1 weeks.  Discussed in detail regarding plan with patient's father over the phone. Answered all questions.  * Hypotension Likely dehydration and adrenal issues.   BP stable now.  * Primary hyperthyroidism Low TSH with increased free T4. Free T3 . D/w dr Tedd Siassolum repeat results of TSH and T4. Recommends to have pt f/u in one week to evaluate as out pt. No indication to start any meds at present Dr solum's office to call pt or family for appt -this was d/w pt's father in the room at length  * High prolactin   This could be due to Haldol and oxcarbazepine as per Dr. Tedd SiasSolum.   No further workup at this time, but she will follow in office.  * Fall/head trauma: CT head checked no acute abnormalities. -no orthostasis noted  * Suicidal ideation: Care per psychiatry  Above was d/w father at length D/w Dr clapacs and pt is ok to transfer to inpt psych CONSULTS OBTAINED:  Treatment Team:  Audery AmelJohn T Clapacs, MD  DRUG  ALLERGIES:   Allergies  Allergen Reactions  . Advil [Ibuprofen] Swelling  . Penicillins Swelling    DISCHARGE MEDICATIONS:   Current Discharge Medication List    START taking these medications   Details  clonazePAM (KLONOPIN) 0.5 MG tablet Take 1 tablet (0.5 mg total) by mouth 3 (three) times daily. Qty: 30 tablet, Refills: 0    hydrocortisone (CORTEF) 20 MG tablet Take 1 tablet (20 mg total) by mouth 2 (two) times daily. Qty: 20 tablet, Refills: 0      CONTINUE these medications which have NOT CHANGED   Details  alum & mag hydroxide-simeth (MAALOX/MYLANTA) 200-200-20 MG/5ML suspension Take 30 mLs by mouth every 4 (four) hours as needed for indigestion. Qty: 355 mL, Refills: 0    benztropine (COGENTIN) 0.5 MG tablet Take 0.5 mg by mouth 2 (two) times daily.    DULoxetine (CYMBALTA) 30 MG capsule Take 1 capsule (30 mg total) by mouth daily. Qty: 30 capsule, Refills: 0    haloperidol (HALDOL) 2 MG tablet Take 1 tablet (2 mg total) by mouth at bedtime. Qty: 30 tablet, Refills: 0    magnesium hydroxide (MILK OF MAGNESIA) 400 MG/5ML suspension Take 30 mLs by mouth daily as needed for mild constipation. Qty: 360 mL, Refills: 0    Oxcarbazepine (TRILEPTAL) 300 MG tablet Take 0.5 tablets (150 mg total) by mouth 2 (two) times daily. Qty: 60 tablet, Refills: 0        If you experience worsening of your admission symptoms, develop shortness of breath, life threatening  emergency, suicidal or homicidal thoughts you must seek medical attention immediately by calling 911 or calling your MD immediately  if symptoms less severe.  You Must read complete instructions/literature along with all the possible adverse reactions/side effects for all the Medicines you take and that have been prescribed to you. Take any new Medicines after you have completely understood and accept all the possible adverse reactions/side effects.   Please note  You were cared for by a hospitalist during your  hospital stay. If you have any questions about your discharge medications or the care you received while you were in the hospital after you are discharged, you can call the unit and asked to speak with the hospitalist on call if the hospitalist that took care of you is not available. Once you are discharged, your primary care physician will handle any further medical issues. Please note that NO REFILLS for any discharge medications will be authorized once you are discharged, as it is imperative that you return to your primary care physician (or establish a relationship with a primary care physician if you do not have one) for your aftercare needs so that they can reassess your need for medications and monitor your lab values. Today   SUBJECTIVE   Doing ok. Sleepy. Eating ok. denies any dizziness Dad in the room. Questions answered  VITAL SIGNS:  Blood pressure (!) 114/59, pulse (!) 59, temperature 97.8 F (36.6 C), temperature source Oral, resp. rate 16, height 5\' 11"  (1.803 m), weight 72.6 kg (160 lb), SpO2 99 %.  I/O:   Intake/Output Summary (Last 24 hours) at 05/22/16 1129 Last data filed at 05/22/16 0458  Gross per 24 hour  Intake              923 ml  Output                0 ml  Net              923 ml    PHYSICAL EXAMINATION:  GENERAL:  25 y.o.-year-old patient lying in the bed with no acute distress.  EYES: Pupils equal, round, reactive to light and accommodation. No scleral icterus. Extraocular muscles intact.  HEENT: Head atraumatic, normocephalic. Oropharynx and nasopharynx clear.  NECK:  Supple, no jugular venous distention. No thyroid enlargement, no tenderness.  LUNGS: Normal breath sounds bilaterally, no wheezing, rales,rhonchi or crepitation. No use of accessory muscles of respiration.  CARDIOVASCULAR: S1, S2 normal. No murmurs, rubs, or gallops.  ABDOMEN: Soft, non-tender, non-distended. Bowel sounds present. No organomegaly or mass.  EXTREMITIES: No pedal edema,  cyanosis, or clubbing.  NEUROLOGIC: Cranial nerves II through XII are intact. Muscle strength 5/5 in all extremities. Sensation intact. Gait not checked.  PSYCHIATRIC: The patient is alert and oriented x 3.  SKIN: No obvious rash, lesion, or ulcer.   DATA REVIEW:   CBC   Recent Labs Lab 05/20/16 0610  WBC 5.6  HGB 13.4  HCT 37.3*  PLT 154    Chemistries   Recent Labs Lab 05/20/16 0610 05/22/16 0509  NA 140  --   K 4.4  --   CL 108  --   CO2 29  --   GLUCOSE 89  --   BUN 13  --   CREATININE 0.76  --   CALCIUM 8.9  --   AST  --  25  ALT  --  34  ALKPHOS  --  83  BILITOT  --  0.9    Microbiology Results  No results found for this or any previous visit (from the past 240 hour(s)).  RADIOLOGY:  No results found.   Management plans discussed with the patient, family and they are in agreement.  CODE STATUS:     Code Status Orders        Start     Ordered   05/19/16 1909  Full code  Continuous     05/19/16 1908    Code Status History    Date Active Date Inactive Code Status Order ID Comments User Context   05/16/2016  8:00 PM 05/19/2016  6:37 PM Full Code [id number]  Audery Amel, MD Inpatient      TOTAL TIME TAKING CARE OF THIS PATIENT:40 minutes.    Deborah Dondero M.D on 05/22/2016 at 11:29 AM  Between 7am to 6pm - Pager - [phone] After 6pm go to [email or web address] - password [email or web address]  [facility]  Office  [phone]  CC: Primary care physician; Shelda Pal, MD

## 2016-05-23 DIAGNOSIS — F29 Unspecified psychosis not due to a substance or known physiological condition: Secondary | ICD-10-CM

## 2016-05-23 DIAGNOSIS — F2089 Other schizophrenia: Secondary | ICD-10-CM

## 2016-05-23 MED ORDER — DULOXETINE HCL 30 MG PO CPEP: 30.0000 mg | ORAL_CAPSULE | Freq: Every day | ORAL | 0 refills | Status: AC

## 2016-05-23 MED ORDER — BENZTROPINE MESYLATE 0.5 MG PO TABS: 0.5000 mg | ORAL_TABLET | Freq: Two times a day (BID) | ORAL | 0 refills | Status: AC

## 2016-05-23 MED ORDER — OXCARBAZEPINE 300 MG PO TABS: 150.0000 mg | ORAL_TABLET | Freq: Two times a day (BID) | ORAL | 0 refills | Status: AC

## 2016-05-23 MED ORDER — HALOPERIDOL 2 MG PO TABS: 2.0000 mg | ORAL_TABLET | Freq: Every day | ORAL | 0 refills | Status: AC

## 2016-05-23 MED ORDER — HYDROCORTISONE 20 MG PO TABS: 20.0000 mg | ORAL_TABLET | Freq: Two times a day (BID) | ORAL | 0 refills | Status: AC

## 2016-05-23 NOTE — Discharge Summary (Signed)
Physician Discharge Summary Note  Patient:  Roberto Watts is an 25 y.o., male MRN:  [id number] DOB:  [birth year] Patient phone:  [phone] (home)  Patient address:   [address] [place] [id number],  Total Time spent with patient: 1 hour  Date of Admission:  05/22/2016 Date of Discharge: 05/23/2016  Reason for Admission:  Psychotic break.  Identifying data. Roberto Watts is a 25 year old male with a history of psychotic depression.  Chief complaint. "I want out. "  History of present illness. Information is obtained from the patient, the chart and his parents. Roberto Watts was initially brought to our hospital on 05/17/2016 on petition from Dr. Rollene RotundaKaren Graham from [facility] where he disclosed suicidal ideation during initial evaluation there. The patient was diagnosed with psychotic depression in 213 and with bipolar disorder by his current provider. During his initial evaluation at [facility] and in our ER he appeared depressedand threatened to shoot himself. The following day however, the patient presented completely differently. He was clearly attending to internal stimuli laughing and giggling inappropriately. He wasirritable, intrusive and loud. He demanded immediate discharge and refused medications. He adamantly denied any symptoms of depression, anxiety, psychosis, or symptoms suggestive of bipolar mania. He denied alcohol or illicit substance use. He was continued on Trileptal and Cymbalta as in the community but we added Zyprexa for psychosis.   Two days following admission he fell. This was accompanied by tachycardia and low blood pressure. He was transferred to medical floor where he was diagnosed with adrenal insufficiency and started on Hydrocortisone with resolution of physical symptoms. As initially Zyprexa was blamed for tachycardia, it was discontinued and low dose of Haldol was initiated.   The patient was transferred back to psychiatry on 9/6/217 to continue treatment. At this  point, the patient again denied any symptoms of depression, anxiety or psychosis. Unlike during initial evaluation, there was no indication of psychosis. His thiking was clear. His behavior appropriate.  The family did not identify any problems and were happy to take him home as soon as possible. Tonight, they were on their way to a parent meeting at [facility].  Past psychiatric history. He was treated for depression and psychosis in2013. He responded well to a combination of Lexapro and Seroquel. For the past two months he has been on [place] sustenna injections, next one on 05/24/2016, Lexapro and trileptal prescribed by [facility] Halina [facility]idensMiriam Fowler [facility]. There were also trials of Latuda and Depakote. He had his first visit with Dr. Rollene [facility]undaKaren [facility] at [facility]. There is remote history of marijuana abuse but the patient has been clean of substances lately.  Family psychiatric history. Depression on mother's side. The mother is taking Cymbalta. Uncle with schizophrenia and bipolar on his father's side.  Social history. He graduated from high school. He went to college for three semesters. Helives with his parents. He was employed until June by [employer].   Principal Problem: Schizophrenia spectrum disorder with psychotic disorder type not yet determined [facility](HCC) Discharge Diagnoses: Patient Active Problem List   Diagnosis Date Noted  . Adrenal insufficiency (HCC) [E27.40] 05/22/2016  . Hyperthyroidism [E05.90] 05/22/2016  . Orthostasis [I95.1] 05/19/2016  . Schizophrenia spectrum disorder with psychotic disorder type not yet determined (HCC) [F20.89, F29] 05/17/2016  . Bipolar I disorder, most recent episode mixed (HCC) [F31.60] 05/17/2016  . 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 Hyperthyroidism 05/22/2016  .  Medical history non-contributory     Past Surgical History:  Procedure Laterality  Date  . TYMPANOSTOMY TUBE PLACEMENT     Family History:  Family History  Problem Relation Age of Onset  . Diabetes Neg Hx    Social History:  History  Alcohol Use No     History  Drug Use  . Types: Cocaine, Marijuana    Social History   Social History  . Marital status: Single    Spouse name: N/A  . Number of children: N/A  . Years of education: N/A   Social History Main Topics  . Smoking status: Never Smoker  . Smokeless tobacco: Never Used  . Alcohol use No  . Drug use:     Types: Cocaine, Marijuana  . Sexual activity: No   Other Topics Concern  . None   Social History Narrative  . None    Hospital Course:    Roberto Watts is a 25 year old male with a history of depression, psychosis and mood instability transferred from medical floo where he was just diagnosed with adrenal insufficiency and hyperthyroidism.  1. Suicidal ideation. The patient adamantly dnies any thoughts, intention, or plans to hurt himself or others. He is able to contract for safety. He is forward thinking and optimistic about the future.  2. Mood/psychosis. We continued Trileptal for mood stabilization, Cymbalta for depression and low dose Haldol with Cogentin for psychosis. For the past two months he has been receiving Invega sustenna injections. Next dose was scheduled for 05/24/2016.  3. Anxiety. He was started on low dose Klonopin on medical floor but we discontinued it.  4. Adrenal insufficiency. He was started on Hydrocortisone 20 mg twice daily.  5. Abnormal thyroid test indicate hyperthyroidism.  6. Diagnostic clarification. Roberto Watts has been seen by at least four different psychiatrist in the past week. Each of [place] adding to now his long list of medications. It is paramount that her is in the care of a single, competent psychiatrist to clarify his diagnoses and guide treatment. It became even more chalanging with concurent medical diagnoses.   7. Disposition. He was discharged to  home with his parents. He will follow up with [facility] for medication management and with Dr. Tedd Sias, Endocrinology. His parents aprove of the plan.   Physical Findings: AIMS:  , ,  ,  ,    CIWA:    COWS:     Musculoskeletal: Strength & Muscle Tone: within normal limits Gait & Station: normal Patient leans: N/A  Psychiatric Specialty Exam: Physical Exam  Nursing note and vitals reviewed.   Review of Systems  All other systems reviewed and are negative.   Blood pressure 106/76, pulse 65, temperature 98 F (36.7 C), temperature source Oral, resp. rate 18, height 5\' 11"  (1.803 m), weight 73.5 kg (162 lb), SpO2 98 %.Body mass index is 22.59 kg/m.  See SRA.                                                       Have you used any form of tobacco in the last 30 days? (Cigarettes, Smokeless Tobacco, Cigars, and/or Pipes): No  Has this patient used any form of tobacco in the last 30 days? (Cigarettes, Smokeless Tobacco, Cigars, and/or Pipes) Yes, No  Blood Alcohol level:  Lab Results  Component Value Date  ETH <5 05/15/2016    Metabolic Disorder Labs:  Lab Results  Component Value Date   HGBA1C 4.7 05/19/2016   Lab Results  Component Value Date   PROLACTIN 97.6 (H) 05/19/2016   Lab Results  Component Value Date   CHOL 122 05/19/2016   TRIG 75 05/19/2016   HDL 29 (L) 05/19/2016   CHOLHDL 4.2 05/19/2016   VLDL 15 05/19/2016   LDLCALC 78 05/19/2016    See Psychiatric Specialty Exam and Suicide Risk Assessment completed by Attending Physician prior to discharge.  Discharge destination:  Home  Is patient on multiple antipsychotic therapies at discharge:  No   Has Patient had three or more failed trials of antipsychotic monotherapy by history:  No  Recommended Plan for Multiple Antipsychotic Therapies: NA  Discharge Instructions    Diet - low sodium heart healthy    Complete by:  As directed   Increase activity slowly    Complete by:  As  directed       Medication List    STOP taking these medications   alum & mag hydroxide-simeth 200-200-20 MG/5ML suspension Commonly known as:  MAALOX/MYLANTA   clonazePAM 0.5 MG tablet Commonly known as:  KLONOPIN   magnesium hydroxide 400 MG/5ML suspension Commonly known as:  MILK OF MAGNESIA     TAKE these medications     Indication  benztropine 0.5 MG tablet Commonly known as:  COGENTIN Take 1 tablet (0.5 mg total) by mouth 2 (two) times daily.  Indication:  Extrapyramidal Reaction caused by Medications   DULoxetine 30 MG capsule Commonly known as:  CYMBALTA Take 1 capsule (30 mg total) by mouth daily.  Indication:  Major Depressive Disorder   haloperidol 2 MG tablet Commonly known as:  HALDOL Take 1 tablet (2 mg total) by mouth at bedtime.  Indication:  Schizophrenia   hydrocortisone 20 MG tablet Commonly known as:  CORTEF Take 1 tablet (20 mg total) by mouth 2 (two) times daily.  Indication:  Decreased Function of the Adrenal Gland   Oxcarbazepine 300 MG tablet Commonly known as:  TRILEPTAL Take 0.5 tablets (150 mg total) by mouth 2 (two) times daily.  Indication:  Manic-Depression        Follow-up recommendations:  Activity:  as tolerated. Diet:  regular. Other:  kep follow up appopintments.  Comments:    Signed: Kristine Linea, MD 05/23/2016, 9:14 AM

## 2016-05-23 NOTE — Progress Notes (Signed)
  [facility] Discharge Plan :  Will you be returning to the same living situation after discharge:  Yes,  Patient will be returning home to live with his parents.  At discharge, do you have transportation home?: Yes,  parents Do you have the ability to pay for your medications: Yes,  Patient has insurance.  Release of information consent forms completed and in the chart;  Patient's signature needed at discharge.  Patient to Follow up at: Follow-up Information    [facility]. Go on 05/24/2016.   Why:  Go to follow-up appointment scheduled with [facility] Roberto Watts on 05/24/2016 at 11am with therapist Roberto Watts. When you arrive for this appointment, they will schedule your appointment with the MD for medication managment and injection.  Contact information: [address] [phone]       Roberto JanusAnna M Solum, MD. Call on 07/01/2016.   Specialty:  Internal Medicine Why:  You have a scheduled appointment with your Endocrinologist Dr. Tedd Watts on 07/01/16 at 2pm. If any appointments become available sooner you will be contacted by the clinic. If you have any questions or concerns please call the number listed.  Contact information: [address] [facility] [place] [id number] [phone]           Next level of care provider has access to [facility] Link:no  Safety Planning and Suicide Prevention discussed: Yes,  Father, Roberto Watts  Have you used any form of tobacco in the last 30 days? (Cigarettes, Smokeless Tobacco, Cigars, and/or Pipes): No  Has patient been referred to the Quitline?: N/A patient is not a smoker  Patient has been referred for addiction treatment: Yes  Roberto Watts MSW, LCSWA 05/23/2016, 11:20 AM

## 2016-05-23 NOTE — Progress Notes (Signed)
Patient discharged home. DC instructions provided and explained. Medications reviewed. Appointments reviewed. Rx given. All questions answered. Pt and family informed to call endocrinologist when home to schedule appt. Pt and family verbalized understanding. Denies SI, HI, AVH. No psychosis noted. Pt stable at discharge.

## 2016-05-23 NOTE — Tx Team (Signed)
Interdisciplinary Treatment and Diagnostic Plan Update  05/23/2016 Time of Session: 10:30am Roberto Watts MRN: [id number]  Principal Diagnosis: Schizophrenia spectrum disorder with psychotic disorder type not yet determined [facility])  Secondary Diagnoses: Principal Problem:   Schizophrenia spectrum disorder with psychotic disorder type not yet determined [name]) Active Problems:   Adrenal insufficiency (HCC)   Hyperthyroidism   Current Medications:  Current Facility-Administered Medications  Medication Dose Route Frequency Provider Last Rate Last Dose  . acetaminophen (TYLENOL) tablet 650 mg  650 mg Oral Q6H PRN Jolanta B Pucilowska, MD      . alum & mag hydroxide-simeth (MAALOX/MYLANTA) 200-200-20 MG/5ML suspension 30 mL  30 mL Oral Q4H PRN Jolanta B Pucilowska, MD      . benztropine (COGENTIN) tablet 0.5 mg  0.5 mg Oral BID Shari Prows, MD   0.5 mg at 05/23/16 0804  . clonazePAM (KLONOPIN) tablet 0.5 mg  0.5 mg Oral TID PRN Shari Prows, MD   0.5 mg at 05/22/16 2233  . DULoxetine (CYMBALTA) DR capsule 30 mg  30 mg Oral Daily Jolanta B Pucilowska, MD   30 mg at 05/23/16 0804  . haloperidol (HALDOL) tablet 2 mg  2 mg Oral QHS Jolanta B Pucilowska, MD   2 mg at 05/22/16 2233  . hydrocortisone (CORTEF) tablet 20 mg  20 mg Oral BID Shari Prows, MD   20 mg at 05/23/16 0804  . magnesium hydroxide (MILK OF MAGNESIA) suspension 30 mL  30 mL Oral Daily PRN Jolanta B Pucilowska, MD      . Oxcarbazepine (TRILEPTAL) tablet 150 mg  150 mg Oral BID Shari Prows, MD   150 mg at 05/23/16 0804   PTA Medications: Prescriptions Prior to Admission  Medication Sig Dispense Refill Last Dose  . alum & mag hydroxide-simeth (MAALOX/MYLANTA) 200-200-20 MG/5ML suspension Take 30 mLs by mouth every 4 (four) hours as needed for indigestion. 355 mL 0   . clonazePAM (KLONOPIN) 0.5 MG tablet Take 1 tablet (0.5 mg total) by mouth 3 (three) times daily. 30 tablet 0   . magnesium hydroxide  (MILK OF MAGNESIA) 400 MG/5ML suspension Take 30 mLs by mouth daily as needed for mild constipation. 360 mL 0   . [DISCONTINUED] benztropine (COGENTIN) 0.5 MG tablet Take 0.5 mg by mouth 2 (two) times daily.   Not Taking at Unknown time  . [DISCONTINUED] DULoxetine (CYMBALTA) 30 MG capsule Take 1 capsule (30 mg total) by mouth daily. 30 capsule 0   . [DISCONTINUED] haloperidol (HALDOL) 2 MG tablet Take 1 tablet (2 mg total) by mouth at bedtime. 30 tablet 0   . [DISCONTINUED] hydrocortisone (CORTEF) 20 MG tablet Take 1 tablet (20 mg total) by mouth 2 (two) times daily. 20 tablet 0   . [DISCONTINUED] Oxcarbazepine (TRILEPTAL) 300 MG tablet Take 0.5 tablets (150 mg total) by mouth 2 (two) times daily. 60 tablet 0     Treatment Modalities: Medication Management, Group therapy, Case management,  1 to 1 session with clinician, Psychoeducation, Recreational therapy.   Physician Treatment Plan for Primary Diagnosis: Schizophrenia spectrum disorder with psychotic disorder type not yet determined (HCC) Long Term Goal(s): Improvement in symptoms so as ready for discharge   Short Term Goals: Ability to disclose and discuss suicidal ideas, Ability to demonstrate self-control will improve, Ability to identify and develop effective coping behaviors will improve, Ability to maintain clinical measurements within normal limits will improve and Compliance with prescribed medications will improve  Medication Management: Evaluate patient's response, side effects, and tolerance  of medication regimen.  Therapeutic Interventions: 1 to 1 sessions, Unit Group sessions and Medication administration.  Evaluation of Outcomes: Adequate for Discharge  Physician Treatment Plan for Secondary Diagnosis: Principal Problem:   Schizophrenia spectrum disorder with psychotic disorder type not yet determined (HCC) Active Problems:   Adrenal insufficiency (HCC)   Hyperthyroidism  Long Term Goal(s): Improvement in symptoms so as  ready for discharge  Short Term Goals: Ability to disclose and discuss suicidal ideas, Ability to demonstrate self-control will improve, Ability to identify and develop effective coping behaviors will improve, Ability to maintain clinical measurements within normal limits will improve and Compliance with prescribed medications will improve  Medication Management: Evaluate patient's response, side effects, and tolerance of medication regimen.  Therapeutic Interventions: 1 to 1 sessions, Unit Group sessions and Medication administration.  Evaluation of Outcomes: Adequate for Discharge   RN Treatment Plan for Primary Diagnosis: Schizophrenia spectrum disorder with psychotic disorder type not yet determined (HCC) Long Term Goal(s): Knowledge of disease and therapeutic regimen to maintain health will improve  Short Term Goals: Compliance with prescribed medications will improve  Medication Management: RN will administer medications as ordered by provider, will assess and evaluate patient's response and provide education to patient for prescribed medication. RN will report any adverse and/or side effects to prescribing provider.  Therapeutic Interventions: 1 on 1 counseling sessions, Psychoeducation, Medication administration, Evaluate responses to treatment, Monitor vital signs and CBGs as ordered, Perform/monitor CIWA, COWS, AIMS and Fall Risk screenings as ordered, Perform wound care treatments as ordered.  Evaluation of Outcomes: Adequate for Discharge   LCSW Treatment Plan for Primary Diagnosis: Schizophrenia spectrum disorder with psychotic disorder type not yet determined (HCC) Long Term Goal(s): Safe transition to appropriate next level of care at discharge, Engage patient in therapeutic group addressing interpersonal concerns.  Short Term Goals: Engage patient in aftercare planning with referrals and resources, Facilitate acceptance of mental health diagnosis and concerns and Increase  skills for wellness and recovery  Therapeutic Interventions: Assess for all discharge needs, 1 to 1 time with Social worker, Explore available resources and support systems, Assess for adequacy in community support network, Educate family and significant other(s) on suicide prevention, Complete Psychosocial Assessment, Interpersonal group therapy.  Evaluation of Outcomes: Adequate for Discharge   Progress in Treatment: Attending groups: No. Participating in groups: No. Taking medication as prescribed: Yes. Toleration medication: Yes. Family/Significant other contact made: Yes, individual(s) contacted:  Father Leonard DowningJohn Ishaq Patient understands diagnosis: Yes. Discussing patient identified problems/goals with staff: Yes. Medical problems stabilized or resolved: Yes. Denies suicidal/homicidal ideation: Yes. Issues/concerns per patient self-inventory: No. Other: n/a  New problem(s) identified: None identified at this time.   New Short Term/Long Term Goal(s): None identified at this time.   Discharge Plan or Barriers:  Patient will be discharging home to live with his parents and has [facility] and medication management.   Reason for Continuation of Hospitalization: Other; describe Patient is discharging 05/23/2016  Estimated Length of Stay: Discharge 05/23/2016  Attendees: Patient:Roberto Judie PetitM Marts 05/23/2016 10:47 AM  Physician: Dr. Jennet MaduroPucilowska, MD 05/23/2016 10:47 AM  Nursing: Shelia MediaJanet Jones, RN 05/23/2016 10:47 AM  RN Care Manager: 05/23/2016 10:47 AM  Social Worker: Fredrich BirksAmaris G. Garnette CzechSampson MSW, LCSWA 05/23/2016 10:47 AM  Recreational Therapist:  05/23/2016 10:47 AM  Other:  05/23/2016 10:47 AM  Other:  05/23/2016 10:47 AM  Other: 05/23/2016 10:47 AM    Scribe for Treatment Team: Arelia LongestAmaris G Dellia Donnelly, LCSWA 05/23/2016 11:08 AM

## 2016-05-23 NOTE — Progress Notes (Signed)
D: Pt denies SI/HI/AVH, affect is flat and sad; mood is withdrawn and guarded with information. Pt is cooperative with treatment plan.Pt appears less anxious,no bizarre behavior noted. Patient is not interacting with peers and staff.  A: Pt was offered support and encouragement. Pt was given scheduled medications. Pt was encouraged to attend groups. Q 15 minute checks were done for safety.  R:Pt did not attend group. Pt is complaint with medication. Pt has no complaints.Pt receptive to treatment and safety maintained on unit.

## 2016-05-23 NOTE — BHH Suicide Risk Assessment (Signed)
[facility]   Principal Problem: Schizophrenia spectrum disorder with psychotic disorder type not yet determined [facility](HCC) Discharge Diagnoses:  Patient Active Problem List   Diagnosis Date Noted  . Adrenal insufficiency (HCC) [E27.40] 05/22/2016  . Hyperthyroidism [E05.90] 05/22/2016  . Orthostasis [I95.1] 05/19/2016  . Schizophrenia spectrum disorder with psychotic disorder type not yet determined (HCC) [F20.89, F29] 05/17/2016  . Bipolar I disorder, most recent episode mixed (HCC) [F31.60] 05/17/2016  . 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   Blood pressure 106/76, pulse 65, temperature 98 F (36.7 C), temperature source Oral, resp. rate 18, height 5\' 11"  (1.803 m), weight 73.5 kg (162 lb), SpO2 98 %.Body mass index is 22.59 kg/m.  See SRA.                                                     Mental Status Per Nursing Assessment::   On Admission:     Demographic Factors:  Male, Adolescent or [place] adult, Caucasian and Unemployed  Loss Factors: Decrease in vocational status and NA  Historical Factors: Family history of mental illness or substance abuse and Impulsivity  Risk Reduction Factors:   Sense of responsibility to family, Living with another person, especially a relative, Positive social support and Positive therapeutic relationship  Continued Clinical Symptoms:  Bipolar Disorder:   Mixed State Schizophrenia:   Less than 24 years old  Cognitive Features That Contribute To Risk:  None    Suicide Risk:  Minimal: No identifiable suicidal ideation.  Patients presenting with no risk factors but with morbid ruminations; may be classified as minimal risk based on the  severity of the depressive symptoms    Plan Of Care/Follow-up recommendations:  Activity:  as tolerated. Diet:  regular. Other:  keep follow up appointments.  Kristine LineaJolanta Adriano Bischof, MD 05/23/2016, 9:11 AM

## 2016-05-23 NOTE — BHH Group Notes (Signed)
Goals Group  Date/Time: 9:00AM Type of Therapy and Topic: Group Therapy: Goals Group: SMART Goals  ?  Participation Level: Moderate  ?  Description of Group:  ?  The purpose of a daily goals group is to assist and guide patients in setting recovery/wellness-related goals. The objective is to set goals as they relate to the crisis in which they were admitted. Patients will be using SMART goal modalities to set measurable goals. Characteristics of realistic goals will be discussed and patients will be assisted in setting and processing how one will reach their goal. Facilitator will also assist patients in applying interventions and coping skills learned in psycho-education groups to the SMART goal and process how one will achieve defined goal.  ?  Therapeutic Goals:  ?  -Patients will develop and document one goal related to or their crisis in which brought them into treatment.  -Patients will be guided by LCSW using SMART goal setting modality in how to set a measurable, attainable, realistic and time sensitive goal.  -Patients will process barriers in reaching goal.  -Patients will process interventions in how to overcome and successful in reaching goal.  ?  Patient's Goal: Patient invited but did not attend. ?  Therapeutic Modalities:  Motivational Interviewing  Cognitive Behavioral Therapy  Crisis Intervention Model  SMART goals setting   Roberto Watts, MSW, LCSW-A 

## 2016-05-23 NOTE — [facility] Suicide Risk Assessment (Signed)
[facility] INPATIENT:  Family/Significant Other Suicide Prevention Education  Suicide Prevention Education:  Education Completed; Leonard DowningJohn Corona (father [phone]),  has been identified by the patient as the family member/significant other with whom the patient will be residing, and identified as the person(s) who will aid the patient in the event of a mental health crisis (suicidal ideations/suicide attempt).  With written consent from the patient, the family member/significant other has been provided the following suicide prevention education, prior to the and/or following the discharge of the patient. CSW spoke with father. Father stated patient has follow-up scheduled with [facility] in [place] on 05/24/16. CSW inquired if family will need any additional assistance with scheduling follow-up appointments for patient. Father stated that patient will now be receiving all of his outpatient services with the [facility] program. Father had no additional questions or concerns for CSW at this time.   The suicide prevention education provided includes the following:  Suicide risk factors  Suicide prevention and interventions  National Suicide Hotline telephone number  [facility] assessment telephone number  [facility] Assistance 911  [facility] and/or Residential Mobile Crisis Unit telephone number  Request made of family/significant other to:  Remove weapons (e.g., guns, rifles, knives), all items previously/currently identified as safety concern.    Remove drugs/medications (over-the-counter, prescriptions, illicit drugs), all items previously/currently identified as a safety concern.  The family member/significant other verbalizes understanding of the suicide prevention education information provided.  The family member/significant other agrees to remove the items of safety concern listed above.  Vickye Astorino G. Garnette CzechSampson MSW, LCSWA 05/23/2016, 10:12 AM

## 2016-05-23 NOTE — Plan of Care (Signed)
  Problem: Education: Goal: Knowledge of the prescribed therapeutic regimen will improve Outcome: Progressing  Patient complaint with medication regimen 

## 2016-05-23 NOTE — H&P (Signed)
Psychiatric Admission Assessment Adult  Patient Identification: Roberto Watts MRN:  [id number] Date of Evaluation:  05/23/2016 Chief Complaint:  suicidal Principal Diagnosis: Schizophrenia spectrum disorder with psychotic disorder type not yet determined [facility]) Diagnosis:   Patient Active Problem List   Diagnosis Date Noted  . Adrenal insufficiency (HCC) [E27.40] 05/22/2016  . Hyperthyroidism [E05.90] 05/22/2016  . Orthostasis [I95.1] 05/19/2016  . Schizophrenia spectrum disorder with psychotic disorder type not yet determined (HCC) [F20.89, F29] 05/17/2016  . Bipolar I disorder, most recent episode mixed (HCC) [F31.60] 05/17/2016  . 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 Roberto Watts is a 25 year old male with a history of psychotic depression.  Chief complaint. "I want out. "  History of present illness. Information is obtained from the patient, the chart and his parents. Roberto Watts was initially brought to our hospital on 05/17/2016 on petition from Dr. Rollene Rotunda from [facility] where he disclosed suicidal ideation during initial evaluation there. The patient was diagnosed with psychotic depression in 213 and with bipolar disorder by his current provider. During his initial evaluation at [facility] and in our ER he appeared depressed and threatened to shoot himself. The following day however, the patient presented completely differently. He was clearly attending to internal stimuli laughing and giggling inappropriately. He was irritable, intrusive and loud. He demanded immediate discharge and refused medications. He adamantly denied any symptoms of depression, anxiety, psychosis, or symptoms suggestive of bipolar mania. He denied alcohol or illicit substance use. He was continued on Trileptal and Cymbalta as in the community but we added Zyprexa for psychosis.   Two days following admission he fell. This was accompanied by tachycardia  and low blood pressure. He was transferred to medical floor where he was diagnosed with adrenal insufficiency and started on Hydrocortisone with resolution of physical symptoms. As initially Zyprexa was blamed for tachycardia, it was discontinued and low dose of Haldol was initiated.   The patient was transferred back to psychiatry on 9/6/217 to continue treatment. At this point, the patient again denied any symptoms of depression, anxiety or psychosis. Unlike during initial evaluation, there was no indication of psychosis. His thiking was clear. His behavior appropriate.  The family did not identify any problems and were happy to take him home as soon as possible. Tonight, they were on their way to a parent meeting at [facility].  Past psychiatric history. He was treated for depression and psychosis in2013. He responded well to a combination of Lexapro and Seroquel. For the past two months he has been on [place] sustenna injections, next one on 05/24/2016, Lexapro and trileptal prescribed by Dr. Halina Maidens at [facility]. There were also trials of Latuda and Depakote. He had his first visit with Dr. Rollene Rotunda at [facility]. There is remote history of marijuana abuse but the patient has been clean of substances lately.  Family psychiatric history. Depression on mother's side. The mother is taking Cymbalta. Uncle with schizophrenia and bipolar on his father's side.  Social history. He graduated from high school. He went to college for three semesters. He lives with his parents. He was employed until June by [employer].   Total Time spent with patient: 1 hour  Is the patient at risk to self? No.  Has the patient been a risk to self in the past 6 months? Yes.    Has the patient been a risk to self within the distant past? No.  Is the patient a risk to others? No.  Has the patient been a risk to others in the past 6 months? No.  Has the patient been a risk to others within  the distant past? No.   Prior Inpatient Therapy:   Prior Outpatient Therapy:    Alcohol Screening: 1. How often do you have a drink containing alcohol?: Never 3. How often do you have six or more drinks on one occasion?: Never 4. How often during the last year have you found that you were not able to stop drinking once you had started?: Never 5. How often during the last year have you failed to do what was normally expected from you becasue of drinking?: Never 6. How often during the last year have you needed a first drink in the morning to get yourself going after a heavy drinking session?: Never 7. How often during the last year have you had a feeling of guilt of remorse after drinking?: Never 8. How often during the last year have you been unable to remember what happened the night before because you had been drinking?: Never 9. Have you or someone else been injured as a result of your drinking?: No 10. Has a relative or friend or a doctor or another health worker been concerned about your drinking or suggested you cut down?: No Alcohol Use Disorder Identification Test Final Score (AUDIT): 0 Brief Intervention: AUDIT score less than 7 or less-screening does not suggest unhealthy drinking-brief intervention not indicated Substance Abuse History in the last 12 months:  Yes.   Consequences of Substance Abuse: Negative Previous Psychotropic Medications: Yes  Psychological Evaluations: No  Past Medical History:  Past Medical History:  Diagnosis Date  . Hyperthyroidism 05/22/2016  . Medical history non-contributory     Past Surgical History:  Procedure Laterality Date  . TYMPANOSTOMY TUBE PLACEMENT     Family History:  Family History  Problem Relation Age of Onset  . Diabetes Neg Hx    Tobacco Screening: Have you used any form of tobacco in the last 30 days? (Cigarettes, Smokeless Tobacco, Cigars, and/or Pipes): No Social History:  History  Alcohol Use No     History  Drug Use   . Types: Cocaine, Marijuana    Additional Social History:                           Allergies:   Allergies  Allergen Reactions  . Advil [Ibuprofen] Swelling  . Penicillins Swelling   Lab Results:  Results for orders placed or performed during the hospital encounter of 05/19/16 (from the past 48 hour(s))  TSH     Status: Abnormal   Collection Time: 05/22/16  5:09 AM  Result Value Ref Range   TSH 0.010 (L) 0.350 - 4.500 uIU/mL  T4, free     Status: Abnormal   Collection Time: 05/22/16  5:09 AM  Result Value Ref Range   Free T4 1.57 (H) 0.61 - 1.12 ng/dL    Comment: (NOTE) Biotin ingestion may interfere with free T4 tests. If the results are inconsistent with the TSH level, previous test results, or the clinical presentation, then consider biotin interference. If needed, order repeat testing after stopping biotin.   Hepatic function panel     Status: Abnormal   Collection Time: 05/22/16  5:09 AM  Result Value Ref Range   Total Protein 6.2 (L) 6.5 - 8.1 g/dL   Albumin 3.9 3.5 - 5.0 g/dL  AST 25 15 - 41 U/L   ALT 34 17 - 63 U/L   Alkaline Phosphatase 83 38 - 126 U/L   Total Bilirubin 0.9 0.3 - 1.2 mg/dL   Bilirubin, Direct 0.1 0.1 - 0.5 mg/dL   Indirect Bilirubin 0.8 0.3 - 0.9 mg/dL    Blood Alcohol level:  Lab Results  Component Value Date   ETH <5 05/15/2016    Metabolic Disorder Labs:  Lab Results  Component Value Date   HGBA1C 4.7 05/19/2016   Lab Results  Component Value Date   PROLACTIN 97.6 (H) 05/19/2016   Lab Results  Component Value Date   CHOL 122 05/19/2016   TRIG 75 05/19/2016   HDL 29 (L) 05/19/2016   CHOLHDL 4.2 05/19/2016   VLDL 15 05/19/2016   LDLCALC 78 05/19/2016    Current Medications: Current Facility-Administered Medications  Medication Dose Route Frequency Provider Last Rate Last Dose  . acetaminophen (TYLENOL) tablet 650 mg  650 mg Oral Q6H PRN Creighton Longley B Cordarrius Coad, MD      . alum & mag hydroxide-simeth  (MAALOX/MYLANTA) 200-200-20 MG/5ML suspension 30 mL  30 mL Oral Q4H PRN Brookelin Felber B Loranda Mastel, MD      . benztropine (COGENTIN) tablet 0.5 mg  0.5 mg Oral BID Shari Prows, MD   0.5 mg at 05/23/16 0804  . clonazePAM (KLONOPIN) tablet 0.5 mg  0.5 mg Oral TID PRN Shari Prows, MD   0.5 mg at 05/22/16 2233  . DULoxetine (CYMBALTA) DR capsule 30 mg  30 mg Oral Daily Iveth Heidemann B Mahli Glahn, MD   30 mg at 05/23/16 0804  . haloperidol (HALDOL) tablet 2 mg  2 mg Oral QHS Emeka Lindner B Heber Hoog, MD   2 mg at 05/22/16 2233  . hydrocortisone (CORTEF) tablet 20 mg  20 mg Oral BID Shari Prows, MD   20 mg at 05/23/16 0804  . magnesium hydroxide (MILK OF MAGNESIA) suspension 30 mL  30 mL Oral Daily PRN Phares Zaccone B Lilton Pare, MD      . Oxcarbazepine (TRILEPTAL) tablet 150 mg  150 mg Oral BID Shari Prows, MD   150 mg at 05/23/16 0804   PTA Medications: Prescriptions Prior to Admission  Medication Sig Dispense Refill Last Dose  . alum & mag hydroxide-simeth (MAALOX/MYLANTA) 200-200-20 MG/5ML suspension Take 30 mLs by mouth every 4 (four) hours as needed for indigestion. 355 mL 0   . clonazePAM (KLONOPIN) 0.5 MG tablet Take 1 tablet (0.5 mg total) by mouth 3 (three) times daily. 30 tablet 0   . magnesium hydroxide (MILK OF MAGNESIA) 400 MG/5ML suspension Take 30 mLs by mouth daily as needed for mild constipation. 360 mL 0   . [DISCONTINUED] benztropine (COGENTIN) 0.5 MG tablet Take 0.5 mg by mouth 2 (two) times daily.   Not Taking at Unknown time  . [DISCONTINUED] DULoxetine (CYMBALTA) 30 MG capsule Take 1 capsule (30 mg total) by mouth daily. 30 capsule 0   . [DISCONTINUED] haloperidol (HALDOL) 2 MG tablet Take 1 tablet (2 mg total) by mouth at bedtime. 30 tablet 0   . [DISCONTINUED] hydrocortisone (CORTEF) 20 MG tablet Take 1 tablet (20 mg total) by mouth 2 (two) times daily. 20 tablet 0   . [DISCONTINUED] Oxcarbazepine (TRILEPTAL) 300 MG tablet Take 0.5 tablets (150 mg total) by mouth  2 (two) times daily. 60 tablet 0     Musculoskeletal: Strength & Muscle Tone: within normal limits Gait & Station: normal Patient leans: N/A  Psychiatric Specialty Exam: I reviewed physical  exam performed on medical floor and agree with the findings. Physical Exam  Nursing note and vitals reviewed.   Review of Systems  All other systems reviewed and are negative.   Blood pressure 106/76, pulse 65, temperature 98 F (36.7 C), temperature source Oral, resp. rate 18, height 5\' 11"  (1.803 m), weight 73.5 kg (162 lb), SpO2 98 %.Body mass index is 22.59 kg/m.  See SRA.                                                      Treatment Plan Summary: Daily contact with patient to assess and evaluate symptoms and progress in treatment and Medication management   Roberto Watts is a 25 year old male with a history of depression, psychosis and mood instability transferred from medical floo where he was just diagnosed with adrenal insufficiency and hyperthyroidism.  1. Suicidal ideation. The patient adamantly dnies any thoughts, intention, or plans to hurt himself or others. He is able to contract for safety. He is forward thinking and optimistic about the future.  2. Mood/psychosis. We continued Trileptal for mood stabilization, Cymbalta for depression and low dose Haldol with Cogentin for psychosis.  3. Anxiety. He was started on low dose Klonopin.   4. Adrenal insufficiency. He was started on Hydrocortisone 20 mg twice daily.  5. Abnormal thyroid test indicate hyperthyroidism.  6. Disposition. He was discharged to home with his parents. He will follow up with [facility] for medication management and with Dr. Tedd Sias, Endocrinology.  Observation Level/Precautions:  15 minute checks  Laboratory:  CBC Chemistry Profile UDS UA  Psychotherapy:    Medications:    Consultations:    Discharge Concerns:    Estimated LOS:  Other:     Physician Treatment Plan for Primary  Diagnosis: Schizophrenia spectrum disorder with psychotic disorder type not yet determined (HCC) Long Term Goal(s): Improvement in symptoms so as ready for discharge  Short Term Goals: Ability to identify changes in lifestyle to reduce recurrence of condition will improve, Ability to demonstrate self-control will improve, Ability to identify and develop effective coping behaviors will improve, Ability to maintain clinical measurements within normal limits will improve and Compliance with prescribed medications will improve  Physician Treatment Plan for Secondary Diagnosis: Principal Problem:   Schizophrenia spectrum disorder with psychotic disorder type not yet determined (HCC) Active Problems:   Adrenal insufficiency (HCC)   Hyperthyroidism  Long Term Goal(s): Improvement in symptoms so as ready for discharge  Short Term Goals: Compliance with prescribed medications will improve  I certify that inpatient services furnished can reasonably be expected to improve the patient's condition.    Kristine Linea, MD 9/7/20179:12 AM

## 2016-05-23 NOTE — [facility] Counselor (Signed)
Adult Comprehensive Assessment  Patient ID: Roberto Watts, male   DOB: 1990/11/30, 25 y.o.   MRN: [id number]  Information Source: Information source: Patient (Information obtained from patient and his father, Roberto Watts.)  Current Stressors:  Educational / Learning stressors: n/a Employment / Job issues: unemployed Family Relationships: Patient has a good relationship with his father.  Financial / Lack of resources (include bankruptcy): Patient has no source of income.  Housing / Lack of housing: Patient is currently living with his parents.  Physical health (include injuries & life threatening diseases): n/a Social relationships: Patient isolates himself and does not communicate with others.  Substance abuse: Patient has history of substance abuse which is now in remission.  Bereavement / Loss: n/a  Living/Environment/Situation:  Living Arrangements: Parent Living conditions (as described by patient or guardian): "Good" How long has patient lived in current situation?: About 5 years.  What is atmosphere in current home: Supportive  Family History:  Marital status: Single Are you sexually active?: No What is your sexual orientation?: heterosexual Has your sexual activity been affected by drugs, alcohol, medication, or emotional stress?: n/a Does patient have children?: No  Childhood History:  By whom was/is the patient raised?: Both parents Additional childhood history information: Parents reports patient was extremely hyper when he was a child. Parents reports that patient was bullied when he was growing up.  Description of patient's relationship with caregiver when they were a child: Patient and mother did not have the best relationship.  Patient's description of current relationship with people who raised him/her: Patient's parents are supportive.  How were you disciplined when you got in trouble as a child/adolescent?: n/a Does patient have siblings?: Yes Number of Siblings:  1 Description of patient's current relationship with siblings: 1 younger brother.  Did patient suffer any verbal/emotional/physical/sexual abuse as a child?: No Did patient suffer from severe childhood neglect?: No Has patient ever been sexually abused/assaulted/raped as an adolescent or adult?: No Was the patient ever a victim of a crime or a disaster?: No Witnessed domestic violence?: No Has patient been effected by domestic violence as an adult?: No  Education:  Highest grade of school patient has completed: 2 years of college. Currently a student?: No Name of school: n/a Learning disability?: No  Employment/Work Situation:   Employment situation: Unemployed Patient's job has been impacted by current illness: Yes Describe how patient's job has been impacted: Patient has been unable to cope with depression and anxiety. Patient became increasingly paranoid and felt people were talking about him.  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 the patient employed at that time?: A few years Has patient ever been in the [employer]?: No Has patient ever served in combat?: No Did You Receive Any Psychiatric Treatment/Services While in Equities trader?: No Are There Guns or Other Weapons in Your Home?: No Are These Comptroller?:  (n/a)  Financial Resources:   Financial resources: Support from parents / caregiver Does patient have a Lawyer or guardian?: No  Alcohol/Substance Abuse:   What has been your use of drugs/alcohol within the last 12 months?: Marijuana If attempted suicide, did drugs/alcohol play a role in this?: No Alcohol/Substance Abuse Treatment Hx: Past Tx, Outpatient Has alcohol/substance abuse ever caused legal problems?: No  Social Support System:   Patient's Community Support System: Good Describe Community Support System: Patient has support from parents.  Type of faith/religion: n/a How does patient's faith help to  cope  with current illness?: n/a  Leisure/Recreation:   Leisure and Hobbies: Exercise, drawing, playing sports.   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, clinicalguitar player In what areas does patient struggle / problems for patient: medication compliance, suicidal thoughts  Discharge Plan:   Does patient have access to transportation?: Yes Will patient be returning to same living situation after discharge?: Yes Currently receiving community mental health services: Yes (From [facility]om) ([facility] [place]) Does patient have financial barriers related to discharge medications?: No  Summary/Recommendations:   Patient is a 25 year old male admitted involuntarily with a diagnosis of Major depressive disorder, recurrent, severe with psychotic features. Patient presented to the hospital with suicidal thoughts, depression, anxiety, irritability, and psychosis.  Patient reports primary triggers for admission were not taking medications, worsening depression, and isolation. Patient was transferred to the medical floor on 05/18/16 for elevated pulse and unstable blood pressure. Patient was readmitted to the behavioral unit on 05/22/16 and has stabilized. Patient has the supports from his immediate family and has follow-up scheduled with the [facility] program in [place]. Patient will benefit from crisis stabilization, medication evaluation, group therapy and psycho education in addition to case management for discharge. At discharge, it is recommended that patient remain compliant with established discharge plan and continued treatment.   Roberto Watts G. Garnette CzechSampson MSW, LCSWA  05/23/2016 10:09 AM

## 2016-05-24 LAB — ALDOSTERONE + RENIN ACTIVITY W/ RATIO
ALDO / PRA RATIO: 37.2 — AB (ref 0.0–30.0)
ALDOSTERONE: 19.6 ng/dL (ref 0.0–30.0)
PRA LC/MS/MS: 0.527 ng/mL/hr (ref 0.167–5.380)

## 2017-04-15 ENCOUNTER — Ambulatory Visit
Admission: EM | Admit: 2017-04-15 | Discharge: 2017-04-15 | Disposition: A | Discharge status: Home / Self Care | Payer: BLUE CROSS/BLUE SHIELD | Attending: Family Medicine | Admitting: Family Medicine

## 2017-04-15 DIAGNOSIS — Z23 Encounter for immunization: Secondary | ICD-10-CM | POA: Diagnosis not present

## 2017-04-15 DIAGNOSIS — W540XXA Bitten by dog, initial encounter: Secondary | ICD-10-CM

## 2017-04-15 DIAGNOSIS — S0185XA Open bite of other part of head, initial encounter: Secondary | ICD-10-CM

## 2017-04-15 DIAGNOSIS — S01551A Open bite of lip, initial encounter: Secondary | ICD-10-CM | POA: Diagnosis not present

## 2017-04-15 MED ORDER — CLINDAMYCIN HCL 300 MG PO CAPS: 300.0000 mg | ORAL_CAPSULE | Freq: Three times a day (TID) | ORAL | 0 refills | Status: DC

## 2017-04-15 MED ORDER — LEVOFLOXACIN 750 MG PO TABS: 750.0000 mg | ORAL_TABLET | Freq: Every day | ORAL | 0 refills | Status: DC

## 2017-04-15 MED ORDER — TETANUS-DIPHTH-ACELL PERTUSSIS 5-2.5-18.5 LF-MCG/0.5 IM SUSP
0.5000 mL | Freq: Once | INTRAMUSCULAR | Status: AC
  Administered 2017-04-15: 0.5 mL via INTRAMUSCULAR

## 2017-04-15 NOTE — ED Triage Notes (Signed)
Patient complains of dog bite to left lip area. Patient states that he was laying with dog and when he woke up he was irritable and nipped him. Patient states that dog is current on all vaccinations. Patient states that this occurred at home approx 1.5 hours ago.

## 2017-04-15 NOTE — ED Provider Notes (Signed)
[facility]    CSN: [id number] Arrival date & time: 04/15/17  1628     History   Chief Complaint Chief Complaint  Patient presents with  . Animal Bite    HPI Roberto Watts is a 26 y.o. male.   Patient's 26 year old white male was bitten by his daughter today. Apparently the dog was elevated from where he will don't dog historical data in the left. Has has not 2 hours ago. According to his mother is allergic to penicillin's history of schizoaffective disorder and bipolar disorder substance abuse in remission. He is allergic to penicillin and Advil. He's had ear tube placement reports prior surgeries concern. History diabetes in the family and he never smoked.   The history is provided by the patient. No language interpreter was used.  Animal Bite  Contact animal:  Dog Time since incident:  2 hours Pain details:    Quality:  Localized   Severity:  Moderate   Timing:  Constant   Progression:  Worsening Incident location:  Home Notifications:  Animal control Animal's rabies vaccination status:  Up to date Animal in possession: yes   Tetanus status:  Unknown Relieved by:  Nothing Worsened by:  Nothing Ineffective treatments:  None tried   Past Medical History:  Diagnosis Date  . Hyperthyroidism 05/22/2016  . Medical history non-contributory     Patient Active Problem List   Diagnosis Date Noted  . Adrenal insufficiency (HCC) 05/22/2016  . Hyperthyroidism 05/22/2016  . Orthostasis 05/19/2016  . Schizophrenia spectrum disorder with psychotic disorder type not yet determined 05/17/2016  . Bipolar I disorder, most recent episode mixed (HCC) 05/17/2016  . Substance abuse in remission 05/15/2016    Past Surgical History:  Procedure Laterality Date  . TYMPANOSTOMY TUBE PLACEMENT         Home Medications    Prior to Admission medications   Medication Sig Start Date End Date Taking? Authorizing Provider  ARIPiprazole (ABILIFY) 30 MG tablet Take 30 mg  by mouth daily.   Yes [provider]  DULoxetine (CYMBALTA) 30 MG capsule Take 1 capsule (30 mg total) by mouth daily. 05/23/16  Yes Pucilowska, Jolanta B, MD  gabapentin (NEURONTIN) 300 MG capsule Take 300 mg by mouth 3 (three) times daily.   Yes [provider]  metFORMIN (GLUCOPHAGE) 500 MG tablet Take 500 mg by mouth 2 (two) times daily with a meal.   Yes [provider]  ziprasidone (GEODON) 20 MG capsule Take 20 mg by mouth 2 (two) times daily with a meal.   Yes [provider]  benztropine (COGENTIN) 0.5 MG tablet Take 1 tablet (0.5 mg total) by mouth 2 (two) times daily. 05/23/16   Pucilowska, Jolanta B, MD  clindamycin (CLEOCIN) 300 MG capsule Take 1 capsule (300 mg total) by mouth 3 (three) times daily. 04/15/17   Hassan RowanWade, Corri Delapaz, MD  haloperidol (HALDOL) 2 MG tablet Take 1 tablet (2 mg total) by mouth at bedtime. 05/23/16   Pucilowska, Jolanta B, MD  hydrocortisone (CORTEF) 20 MG tablet Take 1 tablet (20 mg total) by mouth 2 (two) times daily. 05/23/16   Pucilowska, Jolanta B, MD  levofloxacin (LEVAQUIN) 750 MG tablet Take 1 tablet (750 mg total) by mouth daily. 04/15/17   Hassan RowanWade, Dayami Taitt, MD  Oxcarbazepine (TRILEPTAL) 300 MG tablet Take 0.5 tablets (150 mg total) by mouth 2 (two) times daily. 05/23/16   Shari ProwsPucilowska, Jolanta B, MD    Family History Family History  Problem Relation Age of Onset  .  Diabetes Neg Hx     Social History Social History  Substance Use Topics  . Smoking status: Never Smoker  . Smokeless tobacco: Never Used  . Alcohol use No     Allergies   Advil [ibuprofen] and Penicillins   Review of Systems Review of Systems  Skin: Positive for wound.  All other systems reviewed and are negative.    Physical Exam Triage Vital Signs ED Triage Vitals [04/15/17 1706]  Enc Vitals Group     BP 136/76     Pulse Rate 69     Resp 18     Temp (!) 97.4 F (36.3 C)     Temp Source Oral     SpO2 97 %     Weight 195 lb (88.5 kg)     Height  5\' 11"  (1.803 m)     Head Circumference      Peak Flow      Pain Score 8     Pain Loc      Pain Edu?      Excl. in GC?    No data found.   Updated Vital Signs BP 136/76 (BP Location: Left Arm)   Pulse 69   Temp (!) 97.4 F (36.3 C) (Oral)   Resp 18   Ht 5\' 11"  (1.803 m)   Wt 195 lb (88.5 kg)   SpO2 97%   BMI 27.20 kg/m   Visual Acuity Right Eye Distance:   Left Eye Distance:   Bilateral Distance:    Right Eye Near:   Left Eye Near:    Bilateral Near:     Physical Exam  Constitutional: He is oriented to person, place, and time. He appears well-developed and well-nourished.  HENT:  Head: Normocephalic and atraumatic.  Right Ear: External ear normal.  Left Ear: External ear normal.  Mouth/Throat: Oropharynx is clear and moist.  Eyes: Pupils are equal, round, and reactive to light. EOM are normal.  Neck: Normal range of motion.  Pulmonary/Chest: Effort normal.  Musculoskeletal: Normal range of motion.  Neurological: He is alert and oriented to person, place, and time.  Skin: Skin is warm. There is erythema.  Psychiatric: He has a normal mood and affect.     UC Treatments / Results  Labs (all labs ordered are listed, but only abnormal results are displayed) Labs Reviewed - No data to display  EKG  EKG Interpretation None       Radiology No results found.  Procedures Procedures (including critical care time)  Medications Ordered in UC Medications  Tdap (BOOSTRIX) injection 0.5 mL (0.5 mLs Intramuscular Given 04/15/17 1805)     Initial Impression / Assessment and Plan / UC Course  I have reviewed the triage vital signs and the nursing notes.  Pertinent labs & imaging results that were available during my care of the patient were reviewed by me and considered in my medical decision making (see chart for details).    [place] mother and patient that because the laceration is well sphenoid border if he ever wants to shave his mustache and beard's  recommendation will be for ENT a plastic to repair that type laceration if not he is definitely have a unusual bone. Unfortunately this is also a dog bite so plastic and UTI may also to do anything but once again this is on have to be plastic surgery or ENT with especially plasty final decision. Pinnae sure last tetanus will give a tenderness here place on Levaquin 751 tablet day for  10 days and also placed on Cleocin 300 mg 3 times a day for 10 days follow-up with her dentist the plastic surgery fast needed or with his PCP in about 2 weeks    At the time this dictation was not sure with the possibility [facility] or to [facility] Final Clinical Impressions(s) / UC Diagnoses   Final diagnoses:  Dog bite, initial encounter  Dog bite of face, initial encounter     New Prescriptions New Prescriptions   CLINDAMYCIN (CLEOCIN) 300 MG CAPSULE    Take 1 capsule (300 mg total) by mouth 3 (three) times daily.   LEVOFLOXACIN (LEVAQUIN) 750 MG TABLET    Take 1 tablet (750 mg total) by mouth daily.     Note: This dictation was prepared with Dragon dictation along with smaller phrase technology. Any transcriptional errors that result from this process are unintentional.   Hassan Rowan, MD 04/15/17 [phone]

## 2017-09-04 IMAGING — CT CT HEAD W/O CM
3 series · 15 of 45 positions shown, 18 images · non-contrast
Comparison: None.

CLINICAL DATA: 25-year-old male status post fall while walking,
head injury against wall. Hypotension at the time. Initial
encounter.

EXAM:
CT HEAD WITHOUT CONTRAST
TECHNIQUE: Contiguous axial images were obtained from the base of the skull
through the vertex without intravenous contrast.

[Series 2: head wo · axial · 0.40mm/px · z∈[-64,+51]mm · 9 of 28 slices shown, 12 images]
[im 3/28  brain]
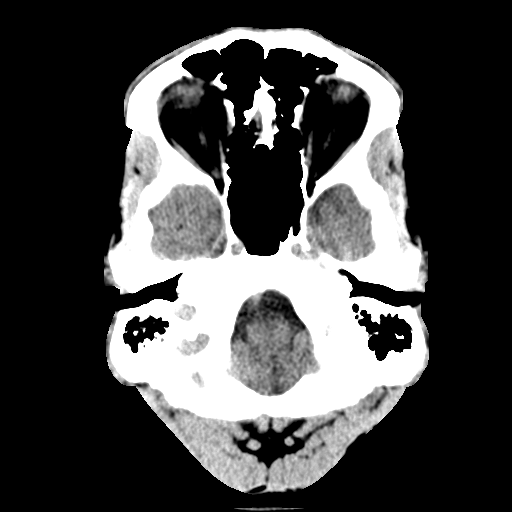
[im 3/28  bone]
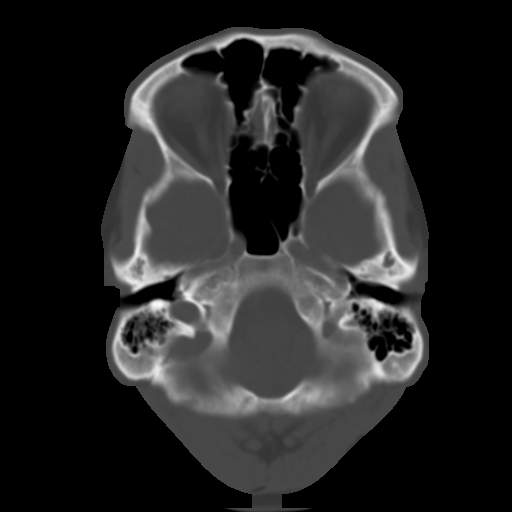
[im 6/28  brain]
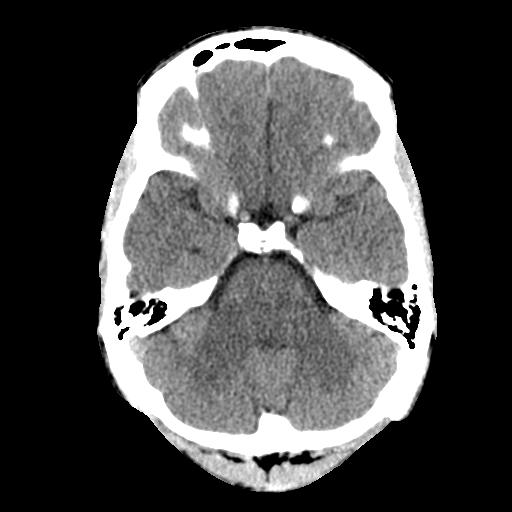
[im 9/28  brain]
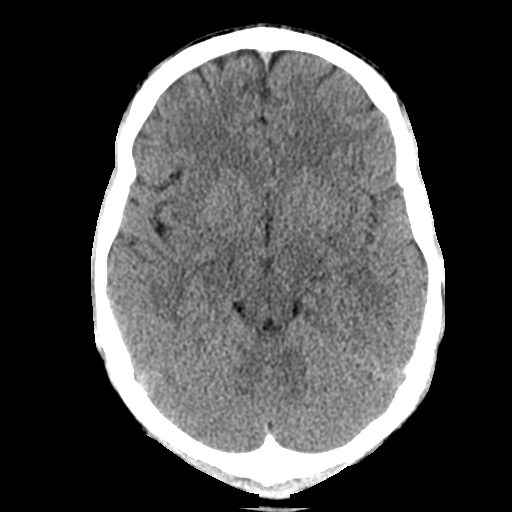
[im 12/28  brain]
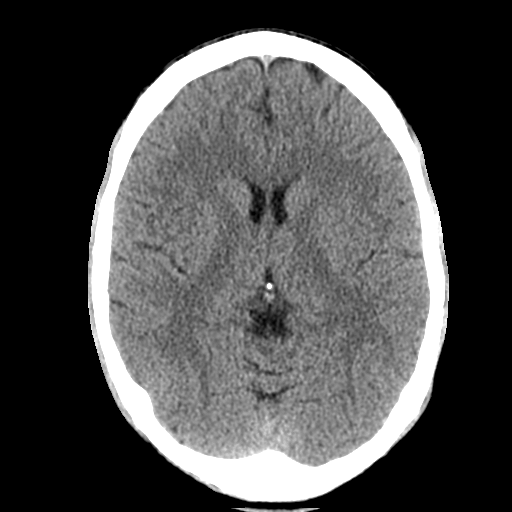
[im 15/28  brain]
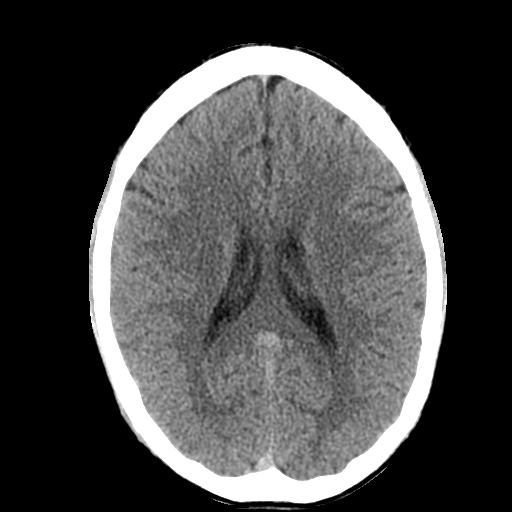
[im 15/28  bone]
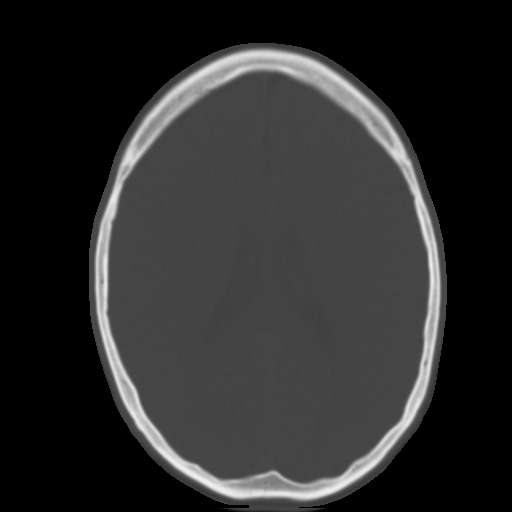
[im 17/28  brain]
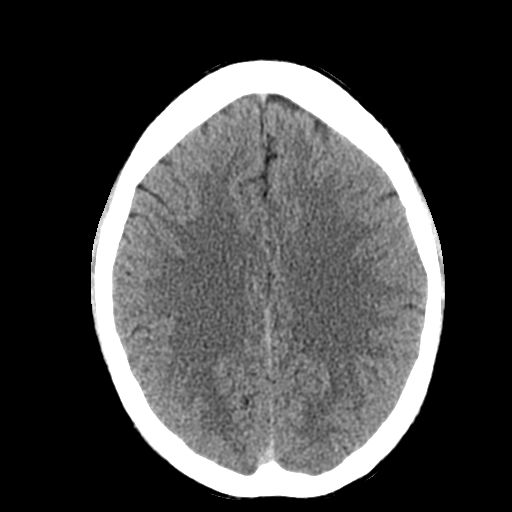
[im 20/28  brain]
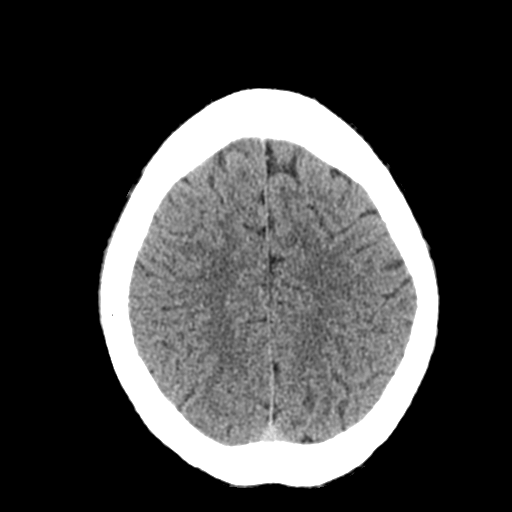
[im 23/28  brain]
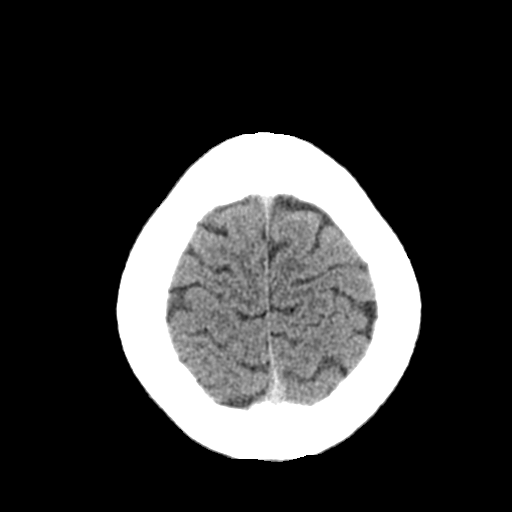
[im 26/28  brain]
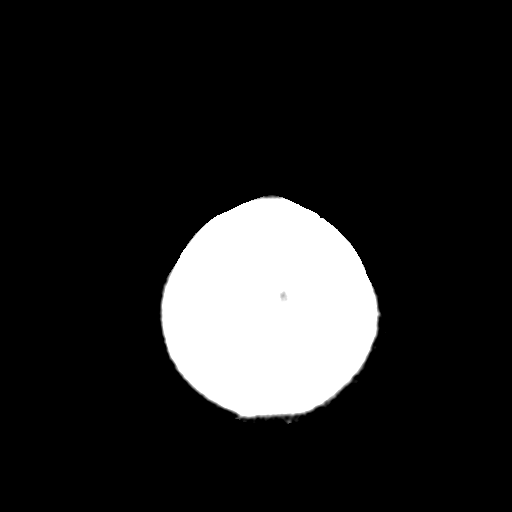
[im 26/28  bone]
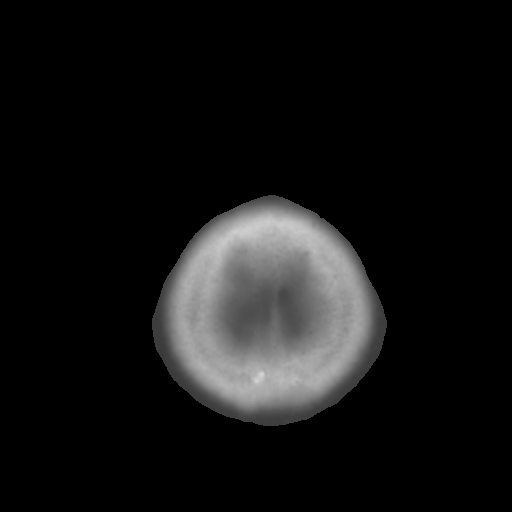

[Series 4: coronal soft tissue · coronal · 0.30mm/px · 3 of 66 slices shown]
[im 22/66  brain]
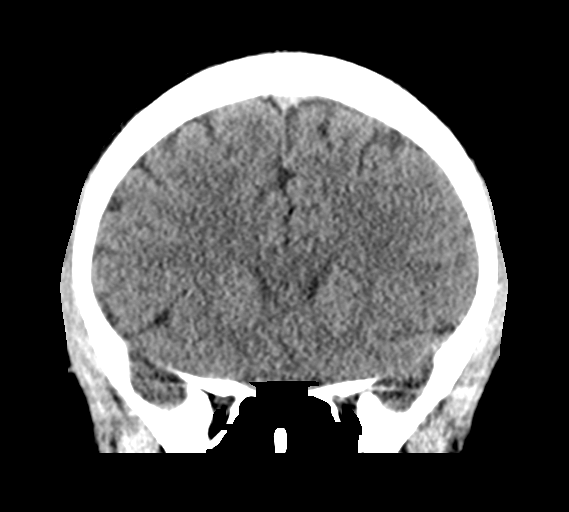
[im 29/66  brain]
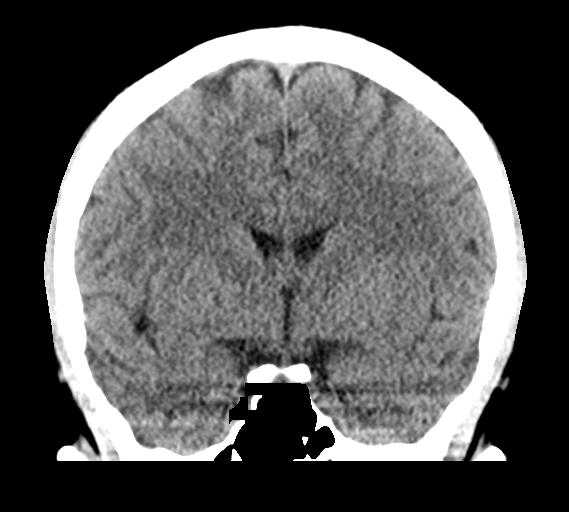
[im 37/66  brain]
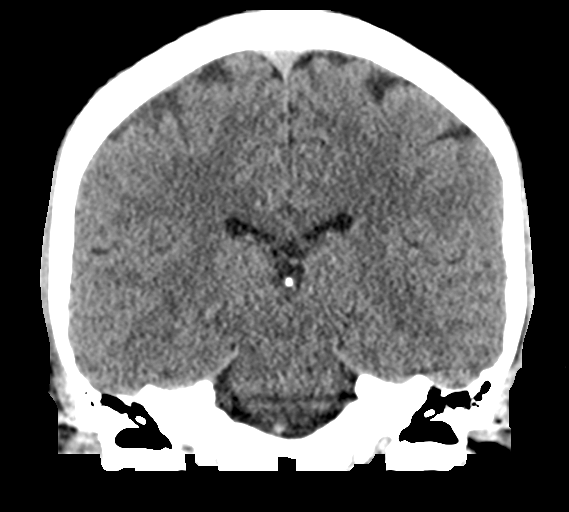

[Series 5: sagittal soft tissue · sagittal · 0.31mm/px · 3 of 55 slices shown]
[im 19/55  brain]
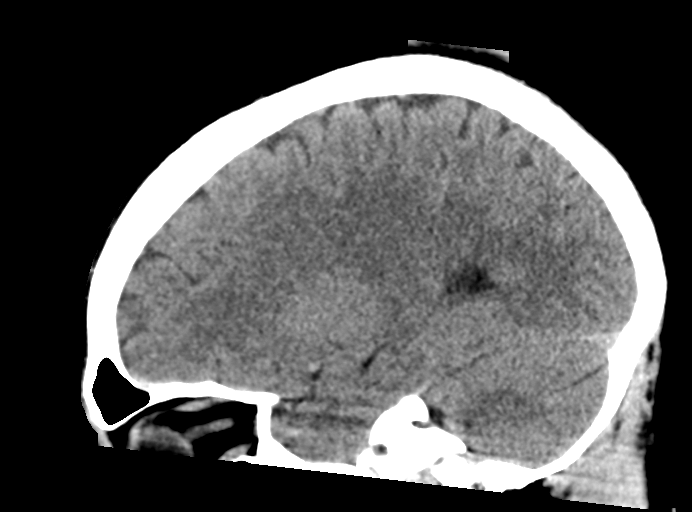
[im 28/55  brain]
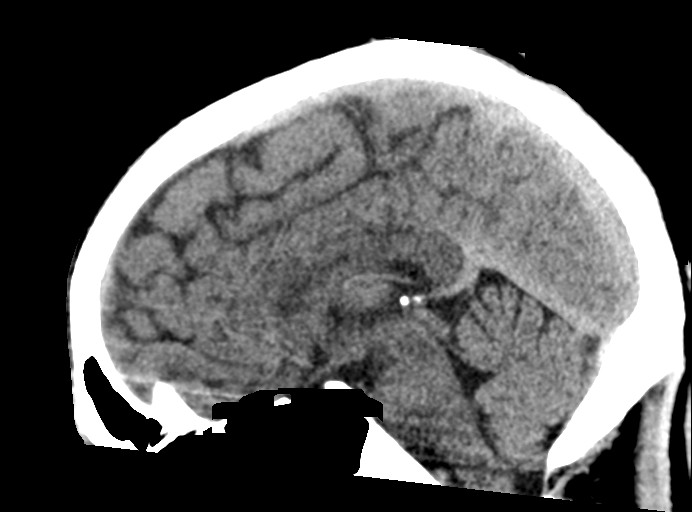
[im 37/55  brain]
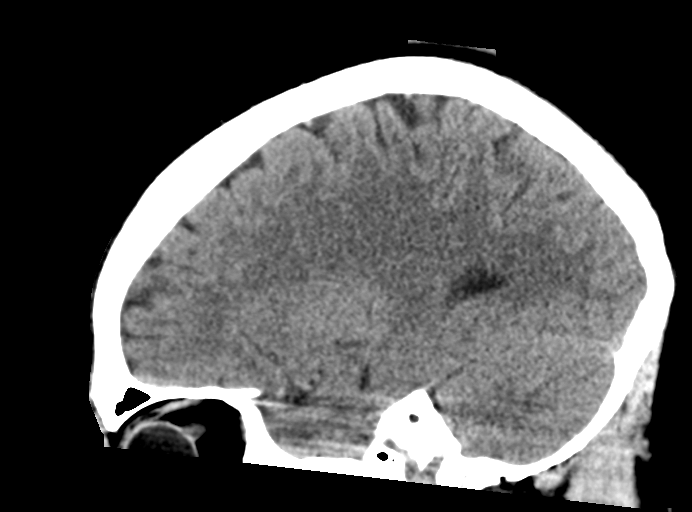

[15 of 45 positions shown; findings below may reference images not displayed]

FINDINGS: Brain: Cerebral volume is normal. No midline shift,
ventriculomegaly, mass effect, evidence of mass lesion, intracranial
hemorrhage or evidence of cortically based acute infarction.
Gray-white matter differentiation is within normal limits throughout
the brain.

Vascular: No suspicious intracranial vascular hyperdensity.

Skull: Calvarium intact.  No osseous abnormality identified.

Sinuses/Orbits: Clear.

Other: No scalp hematoma identified. Visualized orbit soft tissues
are within normal limits.
IMPRESSION: Normal non contrast appearance of the brain. No acute traumatic
injury identified.

## 2019-12-02 ENCOUNTER — Ambulatory Visit: Payer: BC Managed Care – PPO | Attending: Internal Medicine

## 2019-12-02 DIAGNOSIS — Z23 Encounter for immunization: Secondary | ICD-10-CM

## 2019-12-02 NOTE — Progress Notes (Unsigned)
   Covid-19 Vaccination Clinic  Name:  Roberto Watts    MRN: [id number] DOB: [birth year]  12/02/2019  [name] was observed post Covid-19 immunization for 15 minutes without incident. He was provided with Vaccine Information Sheet and instruction to access the V-Safe system.   [name] was instructed to call 911 with any severe reactions post vaccine: Marland Kitchen Difficulty breathing  . Swelling of face and throat  . A fast heartbeat  . A bad rash all over body  . Dizziness and weakness

## 2019-12-28 ENCOUNTER — Ambulatory Visit: Payer: 59 | Attending: Internal Medicine

## 2019-12-28 DIAGNOSIS — Z23 Encounter for immunization: Secondary | ICD-10-CM

## 2019-12-28 NOTE — Progress Notes (Signed)
   Covid-19 Vaccination Clinic  Name:  Roberto Watts    MRN: [id number] DOB: 29-May-1991  12/28/2019  Mr. [name] was observed post Covid-19 immunization for 15 minutes without incident. He was provided with Vaccine Information Sheet and instruction to access the V-Safe system.   Mr. [name] was instructed to call 911 with any severe reactions post vaccine: Marland Kitchen Difficulty breathing  . Swelling of face and throat  . A fast heartbeat  . A bad rash all over body  . Dizziness and weakness   Immunizations Administered    Name Date Dose VIS Date Route   Pfizer COVID-19 Vaccine 12/28/2019 12:07 PM 0.3 mL 08/27/2019 Intramuscular   Manufacturer: [employer], [employer]   Lot: [id number]   NDC: [address]-5

## 2021-03-02 ENCOUNTER — Emergency Department
Admission: EM | Admit: 2021-03-02 | Discharge: 2021-03-03 | Disposition: A | Discharge status: Home / Self Care | Payer: 59 | Attending: Emergency Medicine | Admitting: Emergency Medicine

## 2021-03-02 ENCOUNTER — Other Ambulatory Visit: Payer: Self-pay

## 2021-03-02 ENCOUNTER — Encounter: Payer: Self-pay | Admitting: Emergency Medicine

## 2021-03-02 DIAGNOSIS — T50901A Poisoning by unspecified drugs, medicaments and biological substances, accidental (unintentional), initial encounter: Secondary | ICD-10-CM | POA: Diagnosis present

## 2021-03-02 DIAGNOSIS — Z7984 Long term (current) use of oral hypoglycemic drugs: Secondary | ICD-10-CM | POA: Insufficient documentation

## 2021-03-02 NOTE — ED Triage Notes (Signed)
  Patient BIB EMS for accidental drug overdose.  Patient states he took his night medications and had a beer, then took his medications again accidentally.  Patient states he felt like his heart was racing and felt weird so he called EMS.  Patient is unsure what medications he took a double dose of but has prescriptions for abilify, cymbalta, propranolol, meloxicam, lyrica, and synthroid.  No complaints of pain at this time.

## 2021-03-03 NOTE — ED Notes (Signed)
Pt alert and oriented x4. Calling for ride at this time.

## 2021-03-03 NOTE — ED Notes (Signed)
ED Provider at bedside. 

## 2021-03-03 NOTE — ED Provider Notes (Signed)
[facility] Provider Note   ____________________________________________   Event Date/Time   First MD Initiated Contact with Patient 03/02/21 2257     (approximate)  I have reviewed the triage vital signs and the nursing notes.   HISTORY  Chief Complaint Drug Overdose    HPI Roberto Watts is a 30 y.o. male with the below stated past medical history presents approximately 2 hours after unintentionally taking double doses of his night medications.  Patient states that he took his medications on time and as prescribed and then decided to have a beer and accidentally took his night medicines again.  Patient denies any complaints or symptoms at this time.  Patient currently denies any vision changes, tinnitus, difficulty speaking, facial droop, sore throat, chest pain, shortness of breath, abdominal pain, nausea/vomiting/diarrhea, dysuria, or weakness/numbness/paresthesias in any extremity         Past Medical History:  Diagnosis Date   Hyperthyroidism 05/22/2016   Medical history non-contributory     Patient Active Problem List   Diagnosis Date Noted   Adrenal insufficiency (HCC) 05/22/2016   Hyperthyroidism 05/22/2016   Orthostasis 05/19/2016   Schizophrenia spectrum disorder with psychotic disorder type not yet determined (HCC) 05/17/2016   Bipolar I disorder, most recent episode mixed (HCC) 05/17/2016   Substance abuse in remission (HCC) 05/15/2016    Past Surgical History:  Procedure Laterality Date   TYMPANOSTOMY TUBE PLACEMENT      Prior to Admission medications   Medication Sig Start Date End Date Taking? Authorizing Provider  ARIPiprazole (ABILIFY) 30 MG tablet Take 30 mg by mouth daily.    [provider]  benztropine (COGENTIN) 0.5 MG tablet Take 1 tablet (0.5 mg total) by mouth 2 (two) times daily. 05/23/16   Pucilowska, Jolanta B, MD  clindamycin (CLEOCIN) 300 MG capsule Take 1 capsule (300 mg total) by  mouth 3 (three) times daily. 04/15/17   Hassan Rowan, MD  DULoxetine (CYMBALTA) 30 MG capsule Take 1 capsule (30 mg total) by mouth daily. 05/23/16   Pucilowska, Jolanta B, MD  gabapentin (NEURONTIN) 300 MG capsule Take 300 mg by mouth 3 (three) times daily.    [provider]  haloperidol (HALDOL) 2 MG tablet Take 1 tablet (2 mg total) by mouth at bedtime. 05/23/16   Pucilowska, Jolanta B, MD  hydrocortisone (CORTEF) 20 MG tablet Take 1 tablet (20 mg total) by mouth 2 (two) times daily. 05/23/16   Pucilowska, Jolanta B, MD  levofloxacin (LEVAQUIN) 750 MG tablet Take 1 tablet (750 mg total) by mouth daily. 04/15/17   Hassan Rowan, MD  metFORMIN (GLUCOPHAGE) 500 MG tablet Take 500 mg by mouth 2 (two) times daily with a meal.    [provider]  Oxcarbazepine (TRILEPTAL) 300 MG tablet Take 0.5 tablets (150 mg total) by mouth 2 (two) times daily. 05/23/16   Pucilowska, Jolanta B, MD  ziprasidone (GEODON) 20 MG capsule Take 20 mg by mouth 2 (two) times daily with a meal.    [provider]    Allergies Advil [ibuprofen] and Penicillins  Family History  Problem Relation Age of Onset   Diabetes Neg Hx     Social History Social History   Tobacco Use   Smoking status: Never   Smokeless tobacco: Never  Vaping Use   Vaping Use: Never used  Substance Use Topics   Alcohol use: No   Drug use: Yes    Types: Cocaine, Marijuana    Review of Systems Constitutional: No  fever/chills Eyes: No visual changes. ENT: No sore throat. Cardiovascular: Denies chest pain. Respiratory: Denies shortness of breath. Gastrointestinal: No abdominal pain.  No nausea, no vomiting.  No diarrhea. Genitourinary: Negative for dysuria. Musculoskeletal: Negative for acute arthralgias Skin: Negative for rash. Neurological: Negative for headaches, weakness/numbness/paresthesias in any extremity Psychiatric: Negative for suicidal ideation/homicidal  ideation   ____________________________________________   PHYSICAL EXAM:  VITAL SIGNS: ED Triage Vitals  Enc Vitals Group     BP 03/02/21 2245 (!) 136/97     Pulse Rate 03/02/21 2245 96     Resp 03/02/21 2245 16     Temp 03/02/21 2245 98.2 F (36.8 C)     Temp Source 03/02/21 2245 Oral     SpO2 03/02/21 2245 97 %     Weight 03/02/21 2246 195 lb (88.5 kg)     Height 03/02/21 2246 5\' 11"  (1.803 m)     Head Circumference --      Peak Flow --      Pain Score 03/02/21 2246 0     Pain Loc --      Pain Edu? --      Excl. in GC? --    Constitutional: Alert and oriented. Well appearing and in no acute distress. Eyes: Conjunctivae are normal. PERRL. Head: Atraumatic. Nose: No congestion/rhinnorhea. Mouth/Throat: Mucous membranes are moist. Neck: No stridor Cardiovascular: Grossly normal heart sounds.  Good peripheral circulation. Respiratory: Normal respiratory effort.  No retractions. Gastrointestinal: Soft and nontender. No distention. Musculoskeletal: No obvious deformities Neurologic:  Normal speech and language. No gross focal neurologic deficits are appreciated. Skin:  Skin is warm and dry. No rash noted. Psychiatric: Mood and affect are normal. Speech and behavior are normal.  ____________________________________________   LABS (all labs ordered are listed, but only abnormal results are displayed)  Labs Reviewed - No data to display  PROCEDURES  Procedure(s) performed (including Critical Care):  .1-3 Lead EKG Interpretation  Date/Time: 03/03/2021 2:21 AM Performed by: 03/05/2021, MD Authorized by: Merwyn Katos, MD     Interpretation: normal     ECG rate:  67   ECG rate assessment: normal     Rhythm: sinus rhythm     Ectopy: none     Conduction: normal     ____________________________________________   INITIAL IMPRESSION / ASSESSMENT AND PLAN / ED COURSE  As part of my medical decision making, I reviewed the following data within the  electronic MEDICAL RECORD NUMBER Nursing notes reviewed and incorporated, Labs reviewed, EKG interpreted, Old chart reviewed, Radiograph reviewed and Notes from prior ED visits reviewed and incorporated      Is a 30 year old male with the above-stated past medical history who presents after taking double dose of his nightly medications accidentally.  Upon speaking to poison control they do not recommend any observation or necessary labs/EKG for these medications.  The patient has been reexamined and is ready to be discharged.  All diagnostic results have been reviewed and discussed with the patient/family.  Care plan has been outlined and the patient/family understands all current diagnoses, results, and treatment plans.  There are no new complaints, changes, or physical findings at this time.  All questions have been addressed and answered.  Patient was instructed to, and agrees to follow-up with their primary care physician as well as return to the emergency department if any new or worsening symptoms develop.      ____________________________________________   FINAL CLINICAL IMPRESSION(S) / ED DIAGNOSES  Final diagnoses:  Accidental drug  overdose, initial encounter     ED Discharge Orders     None        Note:  This document was prepared using Dragon voice recognition software and may include unintentional dictation errors.    Merwyn Katos, MD 03/03/21 [phone]

## 2021-03-03 NOTE — ED Notes (Signed)
Spoke with Patty with Poison Control at this time. Reported no observation or labs needed regarding pt taking an extra dose.

## 2021-11-20 ENCOUNTER — Emergency Department
Admission: EM | Admit: 2021-11-20 | Discharge: 2021-11-21 | Disposition: A | Discharge status: Home / Self Care | Payer: 59 | Attending: Emergency Medicine | Admitting: Emergency Medicine

## 2021-11-20 ENCOUNTER — Other Ambulatory Visit: Payer: Self-pay

## 2021-11-20 DIAGNOSIS — F3160 Bipolar disorder, current episode mixed, unspecified: Secondary | ICD-10-CM | POA: Diagnosis not present

## 2021-11-20 DIAGNOSIS — F1911 Other psychoactive substance abuse, in remission: Secondary | ICD-10-CM | POA: Diagnosis present

## 2021-11-20 DIAGNOSIS — F29 Unspecified psychosis not due to a substance or known physiological condition: Secondary | ICD-10-CM | POA: Diagnosis not present

## 2021-11-20 DIAGNOSIS — R45851 Suicidal ideations: Secondary | ICD-10-CM | POA: Insufficient documentation

## 2021-11-20 DIAGNOSIS — R440 Auditory hallucinations: Secondary | ICD-10-CM | POA: Diagnosis present

## 2021-11-20 DIAGNOSIS — F209 Schizophrenia, unspecified: Secondary | ICD-10-CM

## 2021-11-20 DIAGNOSIS — Y900 Blood alcohol level of less than 20 mg/100 ml: Secondary | ICD-10-CM | POA: Diagnosis not present

## 2021-11-20 LAB — COMPREHENSIVE METABOLIC PANEL
ALT: 20 U/L (ref 0–44)
AST: 23 U/L (ref 15–41)
Albumin: 4.9 g/dL (ref 3.5–5.0)
Alkaline Phosphatase: 101 U/L (ref 38–126)
Anion gap: 9 (ref 5–15)
BUN: 12 mg/dL (ref 6–20)
CO2: 29 mmol/L (ref 22–32)
Calcium: 9.6 mg/dL (ref 8.9–10.3)
Chloride: 101 mmol/L (ref 98–111)
Creatinine, Ser: 0.88 mg/dL (ref 0.61–1.24)
GFR, Estimated: >60 mL/min (ref >60)
Glucose, Bld: 92 mg/dL (ref 70–99)
Potassium: 4.2 mmol/L (ref 3.5–5.1)
Sodium: 139 mmol/L (ref 135–145)
Total Bilirubin: 1.6 mg/dL — ABNORMAL HIGH (ref 0.3–1.2)
Total Protein: 7.8 g/dL (ref 6.5–8.1)

## 2021-11-20 LAB — CBC
HCT: 44.6 % (ref 39.0–52.0)
Hemoglobin: 15.1 g/dL (ref 13.0–17.0)
MCH: 32.8 pg (ref 26.0–34.0)
MCHC: 33.9 g/dL (ref 30.0–36.0)
MCV: 97 fL (ref 80.0–100.0)
Platelets: 200 10*3/uL (ref 150–400)
RBC: 4.6 MIL/uL (ref 4.22–5.81)
RDW: 11.6 % (ref 11.5–15.5)
WBC: 6.3 10*3/uL (ref 4.0–10.5)
nRBC: 0 % (ref 0.0–0.2)

## 2021-11-20 LAB — ACETAMINOPHEN LEVEL: Acetaminophen (Tylenol), Serum: <10 ug/mL — ABNORMAL LOW (ref 10–30)

## 2021-11-20 LAB — SALICYLATE LEVEL: Salicylate Lvl: <7 mg/dL — ABNORMAL LOW (ref 7.0–30.0)

## 2021-11-20 LAB — ETHANOL: Alcohol, Ethyl (B): <10 mg/dL (ref <10)

## 2021-11-20 NOTE — ED Notes (Signed)
Sandwich and soft drink given.  

## 2021-11-20 NOTE — ED Triage Notes (Signed)
Pt arrived via [facility], pt states he is here for mental health reasons. Pt states he was having thoughts of harming himself, but states he is not anymore. ? ?Pt denies any auditory or visual hallucinations. ? ?Reports marijuana use today. ? ?Pt states he sees outpatient psychiatry.  ?

## 2021-11-20 NOTE — ED Notes (Signed)
Pt. Alert and oriented, warm and dry, in no distress. Pt. Denies SI, HI, and AVH. Pt states having SI thoughts before coming to ED. Patient asking writer to get him some weed and need a blunt rolled up. [facility] is unable to provide that. Pt. Encouraged to let nursing staff know of any concerns or needs. ? ?

## 2021-11-20 NOTE — ED Provider Notes (Addendum)
? ?[facility] ?Provider Note ? ? ? Event Date/Time  ? First MD Initiated Contact with Patient 11/20/21 2330   ?  (approximate) ? ? ?History  ? ?Psychiatric Evaluation ? ? ?HPI ? ?Roberto Watts is a 31 y.o. male  with pmh of schizophrenia who presents for mental health problem.  Patient tells me that his "mental health is not up to par".  When asked specifically about what this means he says that he is hearing voices.  Notes that he has heard his voice for many years.  They do intermittently tell him to harm himself but this has been an ongoing issue.  He has had intermittent thoughts of suicidality.  Says that he would shoot himself however he has no access to firearms.  Tells me he has had ingested drugs and alcohol in the past and attempted suicide.  Denies any other complaints today.  Denies any active drug or alcohol use.  Lives his parents who were the ones who encouraged him to come to the emergency department today. ? ?  ? ?Past Medical History:  ?Diagnosis Date  ? Hyperthyroidism 05/22/2016  ? Medical history non-contributory   ? ? ?Patient Active Problem List  ? Diagnosis Date Noted  ? Adrenal insufficiency (HCC) 05/22/2016  ? Hyperthyroidism 05/22/2016  ? Orthostasis 05/19/2016  ? Schizophrenia spectrum disorder with psychotic disorder type not yet determined (HCC) 05/17/2016  ? Bipolar I disorder, most recent episode mixed (HCC) 05/17/2016  ? Substance abuse in remission (HCC) 05/15/2016  ? ? ? ?Physical Exam  ?Triage Vital Signs: ?ED Triage Vitals  ?Enc Vitals Group  ?   BP 11/20/21 2254 (!) 133/91  ?   Pulse Rate 11/20/21 2254 (!) 59  ?   Resp 11/20/21 2254 16  ?   Temp 11/20/21 2254 97.6 ?F (36.4 ?C)  ?   Temp Source 11/20/21 2254 Oral  ?   SpO2 11/20/21 2254 97 %  ?   Weight 11/20/21 2252 200 lb (90.7 kg)  ?   Height 11/20/21 2252 5\' 11"  (1.803 m)  ?   Head Circumference --   ?   Peak Flow --   ?   Pain Score 11/20/21 2252 8  ?   Pain Loc --   ?   Pain Edu? --   ?   Excl. in GC?  --   ? ? ?Most recent vital signs: ?Vitals:  ? 11/20/21 2254  ?BP: (!) 133/91  ?Pulse: (!) 59  ?Resp: 16  ?Temp: 97.6 ?F (36.4 ?C)  ?SpO2: 97%  ? ? ? ?General: Awake, no distress.  ?CV:  Good peripheral perfusion.  ?Resp:  Normal effort.  ?Abd:  No distention.  ?Neuro:             Awake, Alert, Oriented x 3  ?Other:  Affect somewhat flat but patient is calm and cooperative ? ? ?ED Results / Procedures / Treatments  ?Labs ?(all labs ordered are listed, but only abnormal results are displayed) ?Labs Reviewed  ?COMPREHENSIVE METABOLIC PANEL - Abnormal; Notable for the following components:  ?    Result Value  ? Total Bilirubin 1.6 (*)   ? All other components within normal limits  ?SALICYLATE LEVEL - Abnormal; Notable for the following components:  ? Salicylate Lvl <7.0 (*)   ? All other components within normal limits  ?ACETAMINOPHEN LEVEL - Abnormal; Notable for the following components:  ? Acetaminophen (Tylenol), Serum <10 (*)   ? All other components within normal  limits  ?RESP PANEL BY RT-PCR (FLU A&B, COVID) ARPGX2  ?ETHANOL  ?CBC  ?URINE DRUG SCREEN, QUALITATIVE ([facility] ONLY)  ? ? ? ?EKG ? ? ? ? ?RADIOLOGY ? ? ? ?PROCEDURES: ? ?Critical Care performed: No ? ? ? ?MEDICATIONS ORDERED IN ED: ?Medications - No data to display ? ? ?IMPRESSION / MDM / ASSESSMENT AND PLAN / ED COURSE  ?I reviewed the triage vital signs and the nursing notes. ?             ?               ? ?Differential diagnosis includes, but is not limited to, decompensated schizophrenia, substance-induced mood disorder ? ?Patient is a 31 year old male with history of schizophrenia who presents today with hearing voices and suicidality.  He says he does have a plan to shoot himself however he has no access to firearms and tells me that he has had these thoughts for many years and has not acted on them.  It was his parents that encouraged him to come to the hospital today.  He is willing to see a psychiatrist.  He has no acute medical complaints today.   Vital signs within normal limits.  I viewed his labs which are also reassuring.  No indication for IVC at this time given he came here voluntarily and I have low suspicion that he is acute harm to himself today.  Will consult psychiatry and placed on psych hold. ? ?She was seen and evaluated by psychiatry who did not feel that he requires inpatient admission at this time.  He is appropriate for discharge.  I agree with this plan. ? ?  ? ? ?FINAL CLINICAL IMPRESSION(S) / ED DIAGNOSES  ? ?Final diagnoses:  ?Suicidal ideation  ? ? ? ?Rx / DC Orders  ? ?ED Discharge Orders   ? ? None  ? ?  ? ? ? ?Note:  This document was prepared using Dragon voice recognition software and may include unintentional dictation errors. ?  ?Georga Hacking, MD ?11/21/21 0125 ? ?  ?Georga Hacking, MD ?11/21/21 [phone] ? ?

## 2021-11-20 NOTE — ED Notes (Addendum)
Pt dressed out by myself and EDT John ? ?Pt belongings: gray-tshirt, white and black shoes, black socks, black underwear, gray pants, gray sweatshirt, home meds in bag as well.  Primary RN notified meds in belongings bag.  ?

## 2021-11-21 NOTE — Discharge Instructions (Signed)
Please continue to take your medications as prescribed by your psychiatrist.  Please follow-up with your psychiatrist for any discussion of changes to your medications. ?

## 2021-11-21 NOTE — ED Notes (Signed)
Patient gave wrier permission to call mother and father to pick up from ED. Mother called no answer so father was called. Father asking questions about care. Writer explained I was limited on amount of info able to give due to HIPPA. Father asking why can't patient just stay here in ED. Advised father this was an ED not inpatient and patient does not meet inpatient criteria. Father states he would be here to pick up patient in 30 mins.  ?

## 2021-11-21 NOTE — Consult Note (Cosign Needed)
[place] Psychiatry Consult   Reason for Consult: Psychiatric Evaluation Referring Physician: Dr. Starleen Blue Patient Identification: Roberto Watts MRN:  [id number] Principal Diagnosis: <principal problem not specified> Diagnosis:  Active Problems:   Substance abuse in remission [facility])   Schizophrenia spectrum disorder with psychotic disorder type not yet determined ([place])   Bipolar I disorder, most recent episode mixed ([place])   Total Time spent with patient: 45 minutes  Subjective: "I am here because I need my medication adjusted." Roberto Watts is a 31 y.o. male patient presented to [facility] ED via [employer] voluntary. Per the ED triage nurses note, the pt states he is here for mental health reasons. Pt states he was having thoughts of harming himself, but states he is not anymore. Pt denies any auditory or visual hallucinations. Reports marijuana use today. Pt states he sees outpatient psychiatry.   This provider saw the patient face-to-face; the chart was reviewed, and consulted with Dr. Starleen Blue on 11/21/2021 due to the patient's care. It was discussed with the EDP that the patient does not meet the criteria to be admitted to the psychiatric inpatient unit. The patient shared that his parents made him come to the ED. He discussed that his medication needs to be adjusted. I discussed with the patient that it is not something that we can do here in the ED. Shred with the patient that it is something that he would have to follow up with his outpatient psychiatrist to adjust his medications. I discussed with the patient that we in the ED could not follow up with him to assess the effectiveness of his medications. The patient was asked if the psychiatric team could call his parents, who refused. Due to the patient being here voluntarily, we cannot call his parents if he does not allow [place] to do so. The patient agrees to follow up with his outpatient provider. On evaluation, the patient is alert and  oriented x 4, foggy due to being asleep, and awakened to be assessed. The patient is calm and cooperative and mood-congruent with affect. The patient does appear to be responding to internal stimuli. The patient is not presenting with any psychotic or paranoid behaviors. Neither is the patient presenting with any delusional thinking. The patient admitted to auditory hallucinations but denied visual hallucinations. The patient denies current suicidal, homicidal, or self-harm ideations.   HPI: Per Dr. Starleen Blue, Roberto Watts is a 31 y.o. male  with pmh of schizophrenia who presents for mental health problem.  Patient tells me that his "mental health is not up to par".  When asked specifically about what this means he says that he is hearing voices.  Notes that he has heard his voice for many years.  They do intermittently tell him to harm himself but this has been an ongoing issue.  He has had intermittent thoughts of suicidality.  Says that he would shoot himself however he has no access to firearms.  Tells me he has had ingested drugs and alcohol in the past and attempted suicide.  Denies any other complaints today.  Denies any active drug or alcohol use.  Lives his parents who were the ones who encouraged him to come to the emergency department today.  Past Psychiatric History: History reviewed. No pertinent past psychiatric history  Risk to Self:   Risk to Others:   Prior Inpatient Therapy:   Prior Outpatient Therapy:    Past Medical History:  Past Medical History:  Diagnosis Date   Hyperthyroidism  05/22/2016   Medical history non-contributory     Past Surgical History:  Procedure Laterality Date   TYMPANOSTOMY TUBE PLACEMENT     Family History:  Family History  Problem Relation Age of Onset   Diabetes Neg Hx    Family Psychiatric  History:  Social History:  Social History   Substance and Sexual Activity  Alcohol Use No     Social History   Substance and Sexual Activity  Drug Use  Yes   Types: Cocaine, Marijuana    Social History   Socioeconomic History   Marital status: Single    Spouse name: Not on file   Number of children: Not on file   Years of education: Not on file   Highest education level: Not on file  Occupational History   Not on file  Tobacco Use   Smoking status: Never   Smokeless tobacco: Never  Vaping Use   Vaping Use: Never used  Substance and Sexual Activity   Alcohol use: No   Drug use: Yes    Types: Cocaine, Marijuana   Sexual activity: Never  Other Topics Concern   Not on file  Social History Narrative   Not on file   Social Determinants of Health   Financial Resource Strain: Not on file  Food Insecurity: Not on file  Transportation Needs: Not on file  Physical Activity: Not on file  Stress: Not on file  Social Connections: Not on file   Additional Social History:    Allergies:   Allergies  Allergen Reactions   Ibuprofen Swelling   Penicillin G Swelling   Penicillins Swelling    Labs:  Results for orders placed or performed during the hospital encounter of 11/20/21 (from the past 48 hour(s))  Comprehensive metabolic panel     Status: Abnormal   Collection Time: 11/20/21 11:00 PM  Result Value Ref Range   Sodium 139 135 - 145 mmol/L   Potassium 4.2 3.5 - 5.1 mmol/L   Chloride 101 98 - 111 mmol/L   CO2 29 22 - 32 mmol/L   Glucose, Bld 92 70 - 99 mg/dL    Comment: Glucose reference range applies only to samples taken after fasting for at least 8 hours.   BUN 12 6 - 20 mg/dL   Creatinine, Ser 0.88 0.61 - 1.24 mg/dL   Calcium 9.6 8.9 - 10.3 mg/dL   Total Protein 7.8 6.5 - 8.1 g/dL   Albumin 4.9 3.5 - 5.0 g/dL   AST 23 15 - 41 U/L   ALT 20 0 - 44 U/L   Alkaline Phosphatase 101 38 - 126 U/L   Total Bilirubin 1.6 (H) 0.3 - 1.2 mg/dL   GFR, Estimated >60 >60 mL/min    Comment: (NOTE) Calculated using the CKD-EPI Creatinine Equation (2021)    Anion gap 9 5 - 15    Comment: Performed at [facility],  [address] [place], [place] [address]  Ethanol     Status: None   Collection Time: 11/20/21 11:00 PM  Result Value Ref Range   Alcohol, Ethyl (B) <10 <10 mg/dL    Comment: (NOTE) Lowest detectable limit for serum alcohol is 10 mg/dL.  For medical purposes only. Performed at [facility], [place], [place] [id number]   Salicylate level     Status: Abnormal   Collection Time: 11/20/21 11:00 PM  Result Value Ref Range   Salicylate Lvl Q000111Q (L) 7.0 - 30.0 mg/dL    Comment: Performed  at [facility], [place], [place] [address]  Acetaminophen level     Status: Abnormal   Collection Time: 11/20/21 11:00 PM  Result Value Ref Range   Acetaminophen (Tylenol), Serum <10 (L) 10 - 30 ug/mL    Comment: (NOTE) Therapeutic concentrations vary significantly. A range of 10-30 ug/mL  may be an effective concentration for many patients. However, some  are best treated at concentrations outside of this range. Acetaminophen concentrations >150 ug/mL at 4 hours after ingestion  and >50 ug/mL at 12 hours after ingestion are often associated with  toxic reactions.  Performed at [facility], [address] [place], [place] [address]   cbc     Status: None   Collection Time: 11/20/21 11:00 PM  Result Value Ref Range   WBC 6.3 4.0 - 10.5 K/uL   RBC 4.60 4.22 - 5.81 MIL/uL   Hemoglobin 15.1 13.0 - 17.0 g/dL   HCT 44.6 39.0 - 52.0 %   MCV 97.0 80.0 - 100.0 fL   MCH 32.8 26.0 - 34.0 pg   MCHC 33.9 30.0 - 36.0 g/dL   RDW 11.6 11.5 - 15.5 %   Platelets 200 150 - 400 K/uL   nRBC 0.0 0.0 - 0.2 %    Comment: Performed at [facility], [address] [place], [place] [address]    No current facility-administered medications for this encounter.   Current Outpatient Medications  Medication Sig Dispense Refill   ARIPiprazole (ABILIFY) 30 MG tablet Take 30 mg by mouth daily.     benztropine (COGENTIN) 0.5 MG tablet Take 1  tablet (0.5 mg total) by mouth 2 (two) times daily. 60 tablet 0   clindamycin (CLEOCIN) 300 MG capsule Take 1 capsule (300 mg total) by mouth 3 (three) times daily. 30 capsule 0   DULoxetine (CYMBALTA) 30 MG capsule Take 1 capsule (30 mg total) by mouth daily. 30 capsule 0   gabapentin (NEURONTIN) 300 MG capsule Take 300 mg by mouth 3 (three) times daily.     haloperidol (HALDOL) 2 MG tablet Take 1 tablet (2 mg total) by mouth at bedtime. 30 tablet 0   hydrocortisone (CORTEF) 20 MG tablet Take 1 tablet (20 mg total) by mouth 2 (two) times daily. 20 tablet 0   levofloxacin (LEVAQUIN) 750 MG tablet Take 1 tablet (750 mg total) by mouth daily. 10 tablet 0   metFORMIN (GLUCOPHAGE) 500 MG tablet Take 500 mg by mouth 2 (two) times daily with a meal.     Oxcarbazepine (TRILEPTAL) 300 MG tablet Take 0.5 tablets (150 mg total) by mouth 2 (two) times daily. 60 tablet 0   ziprasidone (GEODON) 20 MG capsule Take 20 mg by mouth 2 (two) times daily with a meal.      Musculoskeletal: Strength & Muscle Tone: within normal limits Gait & Station: normal Patient leans: N/A  Psychiatric Specialty Exam:  Presentation  General Appearance: Appropriate for Environment  Eye Contact:Good  Speech:Clear and Coherent  Speech Volume:Normal  Handedness:Right   Mood and Affect  Mood:Euthymic  Affect:Appropriate; Congruent   Thought Process  Thought Processes:Coherent  Descriptions of Associations:Intact  Orientation:Full (Time, Place and Person)  Thought Content:Logical  History of Schizophrenia/Schizoaffective disorder:No data recorded Duration of Psychotic Symptoms:No data recorded Hallucinations:Hallucinations: Auditory Description of Auditory Hallucinations: "I don't know what it is telling me."  Ideas of Reference:None  Suicidal Thoughts:Suicidal Thoughts: No  Homicidal Thoughts:Homicidal Thoughts: No   Sensorium  Memory:Immediate Good; Recent Good; Remote  Good  Judgment:Fair  Insight:Good   Executive Functions  Concentration:Fair  Attention Span:Fair  Recall:No data recorded Fund of Knowledge:Fair  Language:Fair   Psychomotor Activity  Psychomotor Activity:Psychomotor Activity: Normal   Assets  Assets:Communication Skills; Desire for Improvement; Resilience; Social Support   Sleep  Sleep:Sleep: Good Number of Hours of Sleep: 8   Physical Exam: Physical Exam Vitals and nursing note reviewed.  Constitutional:      Appearance: Normal appearance.  HENT:     Head: Normocephalic and atraumatic.     Right Ear: External ear normal.     Left Ear: External ear normal.     Nose: Nose normal.  Cardiovascular:     Rate and Rhythm: Bradycardia present.  Pulmonary:     Effort: Pulmonary effort is normal.  Musculoskeletal:        General: Normal range of motion.     Cervical back: Normal range of motion and neck supple.  Neurological:     General: No focal deficit present.     Mental Status: He is alert and oriented to person, place, and time.  Psychiatric:        Attention and Perception: He perceives auditory hallucinations.        Mood and Affect: Affect is blunt and flat.        Speech: Speech is delayed.        Behavior: Behavior normal. Behavior is cooperative.        Thought Content: Thought content normal.        Cognition and Memory: Cognition and memory normal.        Judgment: Judgment is inappropriate.   Review of Systems  Psychiatric/Behavioral:  Positive for hallucinations and substance abuse.   All other systems reviewed and are negative. Blood pressure (!) 133/91, pulse (!) 59, temperature 97.6 F (36.4 C), temperature source Oral, resp. rate 16, height 5\' 11"  (1.803 m), weight 90.7 kg, SpO2 97 %. Body mass index is 27.89 kg/m.  Treatment Plan Summary: Plan -Patient does not meet criteria for psychiatric inpatient admission  Disposition: No evidence of imminent risk to self or others at present.    Patient does not meet criteria for psychiatric inpatient admission. Supportive therapy provided about ongoing stressors. Refer to IOP. Discussed crisis plan, support from social network, calling 911, coming to the Emergency Department, and calling Suicide Hotline.  Caroline Sauger, NP 11/21/2021 2:00 AM

## 2021-11-21 NOTE — ED Notes (Signed)
Patient dressed in personal clothing. Discharge paperwork reviewed with patient. Patient states understanding.  ?

## 2022-07-12 ENCOUNTER — Encounter: Payer: Self-pay | Admitting: Emergency Medicine

## 2022-07-12 ENCOUNTER — Ambulatory Visit
Admission: EM | Admit: 2022-07-12 | Discharge: 2022-07-12 | Disposition: A | Discharge status: Home / Self Care | Payer: 59 | Attending: Emergency Medicine | Admitting: Emergency Medicine

## 2022-07-12 DIAGNOSIS — J0140 Acute pansinusitis, unspecified: Secondary | ICD-10-CM

## 2022-07-12 DIAGNOSIS — J22 Unspecified acute lower respiratory infection: Secondary | ICD-10-CM | POA: Diagnosis not present

## 2022-07-12 MED ORDER — DOXYCYCLINE HYCLATE 100 MG PO CAPS: 100.0000 mg | ORAL_CAPSULE | Freq: Two times a day (BID) | ORAL | 0 refills | Status: AC

## 2022-07-12 MED ORDER — FLUTICASONE PROPIONATE 50 MCG/ACT NA SUSP: 2.0000 | Freq: Every day | NASAL | 0 refills | Status: AC

## 2022-07-12 MED ORDER — ALBUTEROL SULFATE HFA 108 (90 BASE) MCG/ACT IN AERS
1.0000 | INHALATION_SPRAY | RESPIRATORY_TRACT | 0 refills | Status: AC | PRN
Start: 2022-07-12 — End: ?

## 2022-07-12 MED ORDER — AEROCHAMBER MV MISC: 1 refills | Status: AC

## 2022-07-12 NOTE — Discharge Instructions (Addendum)
2 puffs from your albuterol inhaler every 4 hours for 2 days, then every 6 hours for 2 days, then as needed.  You can back off the albuterol if you start to improve sooner.  Finish the doxycycline, even if you feel better.  Flonase, saline nasal irrigation with a NeilMed sinus rinse and distilled water as often as you want.  Promethazine DM for the cough.

## 2022-07-12 NOTE — ED Triage Notes (Signed)
Pt c/o cough, nasal congestion, headache, body aches. Started about 2 weeks ago. Denies fever.

## 2022-07-12 NOTE — ED Provider Notes (Signed)
HPI  SUBJECTIVE:  Roberto Watts is a 31 y.o. male who presents with over 10 days of a cough productive of greenish-yellow sputum, headache, sinus pain and pressure, nasal congestion, yellow rhinorrhea, postnasal drip, wheezing, sore throat and chest soreness secondary to the cough.  States that he was getting better, then got worse.  He is unable to sleep at night secondary to the cough.  No fevers, shortness of breath.  No antibiotics in the past month.  He took cold and flu medication within 6 hours of evaluation.  He has also tried Mucinex and his albuterol inhaler.  The albuterol inhaler helps.  No aggravating factors.  He has a past medical history of asthma, bipolar and schizophrenia.  PCP: [facility].   Past Medical History:  Diagnosis Date   Hyperthyroidism 05/22/2016   Medical history non-contributory     Past Surgical History:  Procedure Laterality Date   TYMPANOSTOMY TUBE PLACEMENT      Family History  Problem Relation Age of Onset   Diabetes Neg Hx     Social History   Tobacco Use   Smoking status: Never   Smokeless tobacco: Never  Vaping Use   Vaping Use: Every day  Substance Use Topics   Alcohol use: No   Drug use: Yes    Types: Cocaine, Marijuana    No current facility-administered medications for this encounter.  Current Outpatient Medications:    albuterol (VENTOLIN HFA) 108 (90 Base) MCG/ACT inhaler, Inhale 1-2 puffs into the lungs every 4 (four) hours as needed for wheezing or shortness of breath., Disp: 1 each, Rfl: 0   doxycycline (VIBRAMYCIN) 100 MG capsule, Take 1 capsule (100 mg total) by mouth 2 (two) times daily for 10 days., Disp: 20 capsule, Rfl: 0   DULoxetine (CYMBALTA) 30 MG capsule, Take 1 capsule (30 mg total) by mouth daily., Disp: 30 capsule, Rfl: 0   fluticasone (FLONASE) 50 MCG/ACT nasal spray, Place 2 sprays into both nostrils daily., Disp: 16 g, Rfl: 0   naproxen (NAPROSYN) 500 MG tablet, Take 500 mg by mouth 2 (two) times  daily., Disp: , Rfl:    pantoprazole (PROTONIX) 40 MG tablet, Take 40 mg by mouth daily., Disp: , Rfl:    pregabalin (LYRICA) 200 MG capsule, Take by mouth., Disp: , Rfl:    Spacer/Aero-Holding Chambers (AEROCHAMBER MV) inhaler, Use as instructed, Disp: 1 each, Rfl: 1   ziprasidone (GEODON) 20 MG capsule, Take 20 mg by mouth 2 (two) times daily with a meal., Disp: , Rfl:    ARIPiprazole (ABILIFY) 30 MG tablet, Take 30 mg by mouth daily., Disp: , Rfl:    benztropine (COGENTIN) 0.5 MG tablet, Take 1 tablet (0.5 mg total) by mouth 2 (two) times daily., Disp: 60 tablet, Rfl: 0   gabapentin (NEURONTIN) 300 MG capsule, Take 300 mg by mouth 3 (three) times daily., Disp: , Rfl:    haloperidol (HALDOL) 2 MG tablet, Take 1 tablet (2 mg total) by mouth at bedtime., Disp: 30 tablet, Rfl: 0   hydrocortisone (CORTEF) 20 MG tablet, Take 1 tablet (20 mg total) by mouth 2 (two) times daily., Disp: 20 tablet, Rfl: 0   metFORMIN (GLUCOPHAGE) 500 MG tablet, Take 500 mg by mouth 2 (two) times daily with a meal., Disp: , Rfl:    Oxcarbazepine (TRILEPTAL) 300 MG tablet, Take 0.5 tablets (150 mg total) by mouth 2 (two) times daily., Disp: 60 tablet, Rfl: 0  Allergies  Allergen Reactions   Ibuprofen Swelling  Penicillin G Swelling   Penicillins Swelling     ROS  As noted in HPI.   Physical Exam  BP 126/86 (BP Location: Right Arm)   Pulse 98   Temp 99.3 F (37.4 C) (Oral)   Resp 18   Ht 5\' 11"  (1.803 m)   Wt 90.7 kg   SpO2 96%   BMI 27.89 kg/m   Constitutional: Well developed, well nourished, no acute distress Eyes:  EOMI, conjunctiva normal bilaterally HENT: Normocephalic, atraumatic,mucus membranes moist.  Erythematous turbinates.  Positive nasal congestion.  Positive frontal and maxillary sinus tenderness.  Normal oropharynx.  No obvious postnasal drip. Neck: No cervical lymphadenopathy Respiratory: Normal inspiratory effort, fair air movement, diffuse expiratory wheezing.  No anterior, lateral  chest wall tenderness Cardiovascular: Normal rate, regular rhythm, no murmurs, rubs, gallops. GI: nondistended skin: No rash, skin intact Musculoskeletal: no deformities Neurologic: Alert & oriented x 3, no focal neuro deficits Psychiatric: Speech and behavior appropriate   ED Course   Medications - No data to display  No orders of the defined types were placed in this encounter.   No results found for this or any previous visit (from the past 24 hour(s)). No results found.  ED Clinical Impression  1. Acute non-recurrent pansinusitis   2. Lower respiratory tract infection      ED Assessment/Plan      Presentation concerning for acute pansinusitis with a lower respiratory tract infection/bronchitis.  Hesitant to prescribe steroids because of the mental illness.  Discussed that steroids can cause psychosis, and patient is amenable to not taking them.  will do regularly scheduled albuterol inhaler with a spacer for the next 4 days, then as needed, doxycycline 100 mg twice daily for 10 days which will cover sinusitis and pneumonia, Flonase 2 sprays in each nostril daily, promethazine DM 5 mL 4 times daily as needed, saline nasal irrigation.  Deferring chest x-ray because the doxycycline will cover any possible pneumonia.  Patient is amenable to this plan.  Follow-up with PCP if not better after finishing the antibiotics or if he gets worse.  Discussed  MDM, treatment plan, and plan for follow-up with patient.patient agrees with plan.   Meds ordered this encounter  Medications   doxycycline (VIBRAMYCIN) 100 MG capsule    Sig: Take 1 capsule (100 mg total) by mouth 2 (two) times daily for 10 days.    Dispense:  20 capsule    Refill:  0   fluticasone (FLONASE) 50 MCG/ACT nasal spray    Sig: Place 2 sprays into both nostrils daily.    Dispense:  16 g    Refill:  0   albuterol (VENTOLIN HFA) 108 (90 Base) MCG/ACT inhaler    Sig: Inhale 1-2 puffs into the lungs every 4 (four)  hours as needed for wheezing or shortness of breath.    Dispense:  1 each    Refill:  0   Spacer/Aero-Holding Chambers (AEROCHAMBER MV) inhaler    Sig: Use as instructed    Dispense:  1 each    Refill:  1      *This clinic note was created using Dragon dictation software. Therefore, there may be occasional mistakes despite careful proofreading.  ?    Melynda Ripple, MD 07/13/22 [id number]

## 2022-07-13 ENCOUNTER — Telehealth: Payer: Self-pay | Admitting: Emergency Medicine

## 2022-07-13 MED ORDER — PROMETHAZINE-DM 6.25-15 MG/5ML PO SYRP: 2.5000 mL | ORAL_SOLUTION | Freq: Four times a day (QID) | ORAL | 0 refills | Status: DC | PRN

## 2022-07-13 NOTE — Telephone Encounter (Signed)
Received phone call from patient's mother regarding his Promethazine DM prescription.  Order was never placed during the visit.  Chart review completed.  Order placed in this encounter.

## 2022-09-10 ENCOUNTER — Ambulatory Visit
Admission: EM | Admit: 2022-09-10 | Discharge: 2022-09-10 | Disposition: A | Discharge status: Home / Self Care | Payer: 59 | Attending: Internal Medicine | Admitting: Internal Medicine

## 2022-09-10 DIAGNOSIS — J111 Influenza due to unidentified influenza virus with other respiratory manifestations: Secondary | ICD-10-CM | POA: Insufficient documentation

## 2022-09-10 DIAGNOSIS — Z1152 Encounter for screening for COVID-19: Secondary | ICD-10-CM | POA: Diagnosis not present

## 2022-09-10 DIAGNOSIS — R52 Pain, unspecified: Secondary | ICD-10-CM | POA: Diagnosis present

## 2022-09-10 DIAGNOSIS — R6883 Chills (without fever): Secondary | ICD-10-CM | POA: Diagnosis present

## 2022-09-10 DIAGNOSIS — J45909 Unspecified asthma, uncomplicated: Secondary | ICD-10-CM | POA: Diagnosis not present

## 2022-09-10 DIAGNOSIS — R059 Cough, unspecified: Secondary | ICD-10-CM | POA: Diagnosis present

## 2022-09-10 LAB — SARS CORONAVIRUS 2 BY RT PCR: SARS Coronavirus 2 by RT PCR: NEGATIVE

## 2022-09-10 MED ORDER — BENZONATATE 200 MG PO CAPS: 200.0000 mg | ORAL_CAPSULE | Freq: Three times a day (TID) | ORAL | 0 refills | Status: AC | PRN

## 2022-09-10 MED ORDER — OSELTAMIVIR PHOSPHATE 75 MG PO CAPS: 75.0000 mg | ORAL_CAPSULE | Freq: Two times a day (BID) | ORAL | 0 refills | Status: AC

## 2022-09-10 MED ORDER — HYDROCODONE BIT-HOMATROP MBR 5-1.5 MG/5ML PO SOLN: 5.0000 mL | Freq: Four times a day (QID) | ORAL | 0 refills | Status: AC | PRN

## 2022-09-10 NOTE — ED Provider Notes (Signed)
[facility]    CSN: [id number] Arrival date & time: 09/10/22  1553      History   Chief Complaint Chief Complaint  Patient presents with   Cough   Generalized Body Aches        Chills    HPI Roberto Watts is a 31 y.o. male who presents with onset of cough 2 days ago, and last night started chilling, and sweating. Has asthma and has not used his inhlaler today.  Has been having coughing fits. His cough is non productive. Has no rhinitis, but has post nasal drainage.      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 Authorizing Provider  albuterol (VENTOLIN HFA) 108 (90 Base) MCG/ACT inhaler Inhale 1-2 puffs into the lungs every 4 (four) hours as needed for wheezing or shortness of breath. 07/12/22  Yes Domenick Gong, MD  ARIPiprazole (ABILIFY) 30 MG tablet Take 30 mg by mouth daily.   Yes [provider]  benzonatate (TESSALON) 200 MG capsule Take 1 capsule (200 mg total) by mouth 3 (three) times daily as needed for cough. 09/10/22  Yes Rodriguez-Southworth, Nettie Elm, PA-C  benztropine (COGENTIN) 0.5 MG tablet Take 1 tablet (0.5 mg total) by mouth 2 (two) times daily. 05/23/16  Yes Pucilowska, Jolanta B, MD  DULoxetine (CYMBALTA) 30 MG capsule Take 1 capsule (30 mg total) by mouth daily. 05/23/16  Yes Pucilowska, Jolanta B, MD  fluticasone  (FLONASE) 50 MCG/ACT nasal spray Place 2 sprays into both nostrils daily. 07/12/22  Yes Domenick Gong, MD  gabapentin (NEURONTIN) 300 MG capsule Take 300 mg by mouth 3 (three) times daily.   Yes [provider]  haloperidol (HALDOL) 2 MG tablet Take 1 tablet (2 mg total) by mouth at bedtime. 05/23/16  Yes Pucilowska, Jolanta B, MD  HYDROcodone bit-homatropine (HYCODAN) 5-1.5 MG/5ML syrup Take 5 mLs by mouth every 6 (six) hours as needed for cough. 09/10/22  Yes Rodriguez-Southworth, Nettie Elm, PA-C  hydrocortisone (CORTEF) 20 MG tablet Take 1 tablet (20 mg total) by mouth 2 (two) times daily. 05/23/16  Yes Pucilowska, Jolanta B, MD  metFORMIN (GLUCOPHAGE) 500 MG tablet Take 500 mg by mouth 2 (two) times daily with a meal.   Yes [provider]  naproxen (NAPROSYN) 500 MG tablet Take 500 mg by mouth 2 (two) times daily. 05/08/22  Yes [provider]  oseltamivir (TAMIFLU) 75 MG capsule Take 1 capsule (75 mg total) by mouth every 12 (twelve) hours. 09/10/22  Yes Rodriguez-Southworth, Nettie Elm, PA-C  Oxcarbazepine (TRILEPTAL) 300 MG tablet Take 0.5 tablets (150 mg total) by mouth 2 (two) times daily. 05/23/16  Yes Pucilowska, Jolanta B, MD  pantoprazole (PROTONIX) 40 MG tablet Take 40 mg by mouth daily. 05/08/22  Yes [provider]  pregabalin (LYRICA) 200 MG capsule Take by mouth. 06/13/22 09/11/22 Yes [provider]  Spacer/Aero-Holding Chambers (AEROCHAMBER MV)  inhaler Use as instructed 07/12/22  Yes Domenick Gong, MD  ziprasidone (GEODON) 20 MG capsule Take 20 mg by mouth 2 (two) times daily with a meal.   Yes [provider]    Family History Family History  Problem Relation Age of Onset   Diabetes Neg Hx     Social History Social History   Tobacco Use   Smoking status: Never   Smokeless tobacco: Never  Vaping Use   Vaping Use: Every day  Substance Use Topics   Alcohol use: No   Drug use: Yes    Types: Cocaine, Marijuana      Allergies   Ibuprofen, Penicillin g, and Penicillins   Review of Systems Review of Systems  Constitutional:  Positive for activity change, chills, diaphoresis and fatigue.  HENT:  Positive for postnasal drip. Negative for congestion, ear discharge, ear pain, rhinorrhea and sore throat.   Eyes:  Negative for discharge.  Respiratory:  Positive for cough and wheezing. Negative for chest tightness and shortness of breath.   Gastrointestinal:  Negative for diarrhea, nausea and vomiting.  Musculoskeletal:  Positive for myalgias. Negative for neck pain and neck stiffness.  Neurological:  Positive for headaches.     Physical Exam Triage Vital Signs ED Triage Vitals  Enc Vitals Group     BP 09/10/22 1816 121/86     Pulse Rate 09/10/22 1816 76     Resp 09/10/22 1816 16     Temp 09/10/22 1816 98.3 F (36.8 C)     Temp Source 09/10/22 1816 Oral     SpO2 09/10/22 1816 98 %     Weight 09/10/22 1814 211 lb (95.7 kg)     Height 09/10/22 1814 5\' 11"  (1.803 m)     Head Circumference --      Peak Flow --      Pain Score 09/10/22 1814 6     Pain Loc --      Pain Edu? --      Excl. in GC? --    No data found.  Updated Vital Signs BP 121/86 (BP Location: Left Arm)   Pulse 76   Temp 98.3 F (36.8 C) (Oral)   Resp 16   Ht 5\' 11"  (1.803 m)   Wt 211 lb (95.7 kg)   SpO2 98%   BMI 29.43 kg/m   Visual Acuity Right Eye Distance:   Left Eye Distance:   Bilateral Distance:    Right Eye Near:   Left Eye Near:    Bilateral Near:      Physical Exam Constitutional:      General: He is not in acute distress.    Appearance: He is not toxic-appearing.  HENT:     Head: Normocephalic.     Right Ear: Tympanic membrane, ear canal and external ear normal.     Left Ear: Ear canal and external ear normal.     Nose: Nose normal.     Mouth/Throat:     Mouth: Mucous membranes are moist.     Pharynx: Oropharynx is clear.  Eyes:     General: No scleral icterus.    Conjunctiva/sclera:  Conjunctivae normal.  Cardiovascular:     Rate and Rhythm: Normal rate and regular rhythm.     Heart sounds: No murmur heard.   Pulmonary:     Effort: Pulmonary effort is normal. No respiratory distress.     Breath sounds: clear   Musculoskeletal:        General: Normal range of  motion.     Cervical back: Neck supple.  Lymphadenopathy:     Cervical: No cervical adenopathy.  Skin:    General: Skin is warm and dry.     Findings: No rash.  Neurological:     Mental Status: He is alert and oriented to person, place, and time.     Gait: Gait normal.  Psychiatric:        Mood and Affect: Mood normal.        Behavior: Behavior normal.        Thought Content: Thought content normal.        Judgment: Judgment normal.    UC Treatments / Results  Labs (all labs ordered are listed, but only abnormal results are displayed) Labs Reviewed  SARS CORONAVIRUS 2 BY RT PCR  Covid test is negative  EKG   Radiology No results found.  Procedures Procedures (including critical care time)  Medications Ordered in UC Medications - No data to display  Initial Impression / Assessment and Plan / UC Course  I have reviewed the triage vital signs and the nursing notes.  Pertinent labs  results that were available during my care of the patient were reviewed by me and considered in my medical decision making (see chart for details).  We are out of Flu test, but due to his flu like symptoms, I went ahead and placed him on Tamiflu, Tessalon and Hycodan for night time as noted.  Final Clinical Impressions(s) / UC Diagnoses   Final diagnoses:  Influenza-like illness     Discharge Instructions      Use your inhaler every 4 hours while awake for 3 days, then only as needed after that     ED Prescriptions     Medication Sig Dispense Auth. Provider   oseltamivir (TAMIFLU) 75 MG capsule Take 1 capsule (75 mg total) by mouth every 12 (twelve) hours. 10 capsule Rodriguez-Southworth, Nettie Elm,  PA-C   benzonatate (TESSALON) 200 MG capsule Take 1 capsule (200 mg total) by mouth 3 (three) times daily as needed for cough. 30 capsule Rodriguez-Southworth, Izel Eisenhardt, PA-C   HYDROcodone bit-homatropine (HYCODAN) 5-1.5 MG/5ML syrup Take 5 mLs by mouth every 6 (six) hours as needed for cough. 120 mL Rodriguez-Southworth, Nettie Elm, PA-C      I have reviewed the PDMP during this encounter.   Garey Ham, [place] 09/10/22 1918

## 2022-09-10 NOTE — ED Triage Notes (Signed)
Pt c/o cough,chills,fever & bodyaches x2 days. No otc tx.

## 2022-09-10 NOTE — Discharge Instructions (Addendum)
Use your inhaler every 4 hours while awake for 3 days, then only as needed after that
